# Patient Record
Sex: Female | Born: 1965 | Race: Black or African American | Hispanic: No | Smoking: Never smoker
Health system: Southern US, Community
[De-identification: ages and names within clinical notes are randomized; demographics above are authoritative.]

## PROBLEM LIST (undated history)

## (undated) DIAGNOSIS — IMO0002 Reserved for concepts with insufficient information to code with codable children: Secondary | ICD-10-CM

## (undated) DIAGNOSIS — T7840XA Allergy, unspecified, initial encounter: Secondary | ICD-10-CM

## (undated) DIAGNOSIS — R011 Cardiac murmur, unspecified: Secondary | ICD-10-CM

## (undated) DIAGNOSIS — E785 Hyperlipidemia, unspecified: Secondary | ICD-10-CM

## (undated) DIAGNOSIS — D649 Anemia, unspecified: Secondary | ICD-10-CM

## (undated) HISTORY — DX: Anemia, unspecified: D64.9

## (undated) HISTORY — PX: BUNIONECTOMY: SHX129

## (undated) HISTORY — DX: Hyperlipidemia, unspecified: E78.5

## (undated) HISTORY — PX: WISDOM TOOTH EXTRACTION: SHX21

## (undated) HISTORY — DX: Allergy, unspecified, initial encounter: T78.40XA

## (undated) HISTORY — DX: Cardiac murmur, unspecified: R01.1

## (undated) HISTORY — PX: ABDOMINAL HYSTERECTOMY: SHX81

## (undated) HISTORY — PX: POLYPECTOMY: SHX149

## (undated) HISTORY — DX: Reserved for concepts with insufficient information to code with codable children: IMO0002

---

## 1998-09-09 ENCOUNTER — Other Ambulatory Visit: Admission: RE | Admit: 1998-09-09 | Discharge: 1998-09-09 | Payer: Self-pay | Admitting: Obstetrics & Gynecology

## 1999-09-22 ENCOUNTER — Ambulatory Visit (HOSPITAL_COMMUNITY): Admission: RE | Admit: 1999-09-22 | Discharge: 1999-09-22 | Payer: Self-pay | Admitting: Family Medicine

## 2002-09-20 ENCOUNTER — Other Ambulatory Visit: Admission: RE | Admit: 2002-09-20 | Discharge: 2002-09-20 | Payer: Self-pay | Admitting: Obstetrics and Gynecology

## 2003-02-11 ENCOUNTER — Observation Stay (HOSPITAL_COMMUNITY): Admission: RE | Admit: 2003-02-11 | Discharge: 2003-02-12 | Payer: Self-pay | Admitting: Obstetrics and Gynecology

## 2003-02-11 ENCOUNTER — Encounter (INDEPENDENT_AMBULATORY_CARE_PROVIDER_SITE_OTHER): Payer: Self-pay

## 2003-09-30 ENCOUNTER — Encounter: Admission: RE | Admit: 2003-09-30 | Discharge: 2003-09-30 | Payer: Self-pay | Admitting: Family Medicine

## 2005-02-09 ENCOUNTER — Ambulatory Visit: Payer: Self-pay | Admitting: Internal Medicine

## 2007-06-06 ENCOUNTER — Ambulatory Visit: Payer: Self-pay | Admitting: Family Medicine

## 2007-07-12 ENCOUNTER — Ambulatory Visit: Payer: Self-pay | Admitting: Family Medicine

## 2007-08-14 ENCOUNTER — Ambulatory Visit: Payer: Self-pay | Admitting: Family Medicine

## 2008-02-29 ENCOUNTER — Ambulatory Visit: Payer: Self-pay | Admitting: Family Medicine

## 2008-02-29 ENCOUNTER — Encounter: Admission: RE | Admit: 2008-02-29 | Discharge: 2008-02-29 | Payer: Self-pay | Admitting: Family Medicine

## 2008-03-06 ENCOUNTER — Encounter: Admission: RE | Admit: 2008-03-06 | Discharge: 2008-03-06 | Payer: Self-pay | Admitting: Chiropractic Medicine

## 2009-10-20 ENCOUNTER — Ambulatory Visit: Payer: Self-pay | Admitting: Physician Assistant

## 2010-03-18 ENCOUNTER — Ambulatory Visit: Payer: Self-pay | Admitting: Physician Assistant

## 2010-09-16 ENCOUNTER — Ambulatory Visit (INDEPENDENT_AMBULATORY_CARE_PROVIDER_SITE_OTHER): Payer: 59 | Admitting: Family Medicine

## 2010-09-16 ENCOUNTER — Encounter: Payer: Self-pay | Admitting: Family Medicine

## 2010-09-16 DIAGNOSIS — J309 Allergic rhinitis, unspecified: Secondary | ICD-10-CM

## 2010-09-16 DIAGNOSIS — Z0000 Encounter for general adult medical examination without abnormal findings: Secondary | ICD-10-CM

## 2010-09-16 DIAGNOSIS — Z13220 Encounter for screening for lipoid disorders: Secondary | ICD-10-CM

## 2010-09-16 DIAGNOSIS — R519 Headache, unspecified: Secondary | ICD-10-CM

## 2010-09-16 DIAGNOSIS — Z136 Encounter for screening for cardiovascular disorders: Secondary | ICD-10-CM

## 2010-09-16 DIAGNOSIS — E785 Hyperlipidemia, unspecified: Secondary | ICD-10-CM

## 2010-09-16 DIAGNOSIS — R51 Headache: Secondary | ICD-10-CM

## 2010-09-16 DIAGNOSIS — R011 Cardiac murmur, unspecified: Secondary | ICD-10-CM

## 2010-09-16 LAB — BASIC METABOLIC PANEL
BUN: 10 mg/dL (ref 6–23)
Potassium: 4.1 mEq/L (ref 3.5–5.1)

## 2010-09-16 LAB — LIPID PANEL
HDL: 87.8 mg/dL (ref >39.00)
VLDL: 5.6 mg/dL (ref 0.0–40.0)

## 2010-09-16 MED ORDER — FLUTICASONE FUROATE 27.5 MCG/SPRAY NA SUSP: 2.0000 | Freq: Every day | NASAL | Status: DC

## 2010-09-16 NOTE — Assessment & Plan Note (Addendum)
Reviewed preventive care protocols, scheduled due services, and updated immunizations Discussed nutrition, exercise, diet, and healthy lifestyle.  Orders Placed This Encounter  Procedures  . Lipid panel  . Basic metabolic panel  . Ambulatory referral to Cardiology    Referral Priority:  Routine    Referral Type:  Consultation    Referral Reason:  Specialty Services Required    Requested Specialty:  Cardiology    Number of Visits Requested:  1

## 2010-09-16 NOTE — Progress Notes (Signed)
Subjective:    Patient ID: Patricia Dunlap, female    DOB: [birth year], 45 y.o.   MRN: [id number]  HPI 45 yo here to establish care and for CPX.  Overall, doing very well. Has OBGYN, Dr. Cherly Hensen- last mammogram and physical in October 2011. S/p hysterectomy for fibroid tumors in 2005.  Allergic rhinitis-  Seasonal allergies, notices it is getting worse.  Having frontal headaches several times per week.  Headaches not associated with photophobia, nausea, vomiting, or focal neurological deficits.  Taking Excedrin migraine as needed which does help with headaches. Claritin OTC not helping.  Heart murmur- diagnosed in her 30s.  No h/o palpitations, CP, SOB or LE edema.  Very physically active without difficulty. Had an echo 2 years ago, Dr. Clarene Duke.   Review of Systems  Constitutional: Negative for fever, chills and unexpected weight change.  HENT: Positive for congestion, sneezing and postnasal drip.   Eyes: Positive for itching.  Respiratory: Negative for cough, shortness of breath and wheezing.   Genitourinary: Negative for dysuria.  Neurological: Negative for dizziness.  Psychiatric/Behavioral: Negative for dysphoric mood.   Past Medical History  Diagnosis Date  . Allergy   . Heart murmur     Past Surgical History  Procedure Date  . Abdominal hysterectomy     History   Social History  . Marital Status: Married    Spouse Name: N/A    Number of Children: N/A  . Years of Education: N/A   Occupational History  . Not on file.   Social History Main Topics  . Smoking status: Never Smoker   . Smokeless tobacco: Not on file  . Alcohol Use: Yes  . Drug Use: Not on file  . Sexually Active: Yes   Other Topics Concern  . Not on file   Social History Narrative  . No narrative on file    Family History  Problem Relation Age of Onset  . Hyperlipidemia Mother   . Hypertension Father     No Known Allergies  No current outpatient prescriptions on file prior to  visit.    BP 120/80  Pulse 72  Temp(Src) 98.1 F (36.7 C) (Oral)  Ht 5' (1.524 m)  Wt 115 lb (52.164 kg)  BMI 22.46 kg/m2       Objective:   Physical Exam  General:  Well-developed,well-nourished,in no acute distress; alert,appropriate and cooperative throughout examination Head:  normocephalic and atraumatic.   Eyes:  vision grossly intact, pupils equal, pupils round, and pupils reactive to light.   Ears:  R ear normal and L ear normal.   Nose:  Boggy turbinates, sinuses NTTP, no signs of obstruction.   Mouth:  good dentition.   Neck:  No deformities, masses, or tenderness noted. Breasts:  Deferred, has OBGYN Lungs:  Normal respiratory effort, chest expands symmetrically. Lungs are clear to auscultation, no crackles or wheezes. Heart:  Normal rate and regular rhythm. 2/6 murmur. Abdomen:  Bowel sounds positive,abdomen soft and non-tender without masses, organomegaly or hernias noted. Genitalia:  Pelvic Exam:  deferred Msk:  No deformity or scoliosis noted of thoracic or lumbar spine.   Extremities:  No clubbing, cyanosis, edema, or deformity noted with normal full range of motion of all joints.   Neurologic:  alert & oriented X3 and gait normal.   Skin:  Intact without suspicious lesions or rashes Cervical Nodes:  No lymphadenopathy noted Axillary Nodes:  No palpable lymphadenopathy Psych:  Cognition and judgment appear intact. Alert and cooperative with normal attention span and  concentration. No apparent delusions, illusions, hallucinations         Assessment & Plan:

## 2010-09-16 NOTE — Assessment & Plan Note (Signed)
Likely multifactorial- tension and sinus.  Added flonase, also counselled against using excedrin migraine or other NSAIDs on regular basis as they can lead to rebound headaches and renal issues.  Pt will call in 2 weeks with an update of her headaches.

## 2010-09-16 NOTE — Patient Instructions (Signed)
Great to meet you. Please stop by to see Marion on your way out. 

## 2010-09-16 NOTE — Assessment & Plan Note (Signed)
Will add Flonase.  Continue Claritin.

## 2010-09-16 NOTE — Assessment & Plan Note (Signed)
Sounds benign, will refer to cards for follow up.

## 2010-09-24 ENCOUNTER — Encounter: Payer: Self-pay | Admitting: Family Medicine

## 2010-10-07 ENCOUNTER — Encounter: Payer: Self-pay | Admitting: Family Medicine

## 2010-10-23 ENCOUNTER — Institutional Professional Consult (permissible substitution): Payer: 59 | Admitting: Cardiology

## 2010-11-12 ENCOUNTER — Telehealth: Payer: Self-pay | Admitting: Cardiology

## 2010-11-12 ENCOUNTER — Encounter: Payer: Self-pay | Admitting: Cardiology

## 2010-11-12 ENCOUNTER — Ambulatory Visit (INDEPENDENT_AMBULATORY_CARE_PROVIDER_SITE_OTHER): Payer: 59 | Admitting: Cardiology

## 2010-11-12 DIAGNOSIS — R011 Cardiac murmur, unspecified: Secondary | ICD-10-CM

## 2010-11-12 DIAGNOSIS — J309 Allergic rhinitis, unspecified: Secondary | ICD-10-CM

## 2010-11-12 NOTE — Assessment & Plan Note (Signed)
Patient found to have a soft ejection murmur. She had an echocardiogram at Dr. Fredirick Maudlin office some years ago. I will request records concerning her visit and previous echocardiogram. If her echo was normal then I do not think further evaluation is needed.

## 2010-11-12 NOTE — Telephone Encounter (Addendum)
Records received back from [facility] gave to [facility]  11/12/10  Echo received from [facility] gave to [facility]  12/01/10/km

## 2010-11-12 NOTE — Assessment & Plan Note (Signed)
Management per primary care. 

## 2010-11-12 NOTE — Telephone Encounter (Signed)
ROI faxed over to [facility] @ [phone]  11/12/10/km

## 2010-11-12 NOTE — Patient Instructions (Signed)
Your physician recommends that you schedule a follow-up appointment as needed  

## 2010-11-12 NOTE — Progress Notes (Signed)
HPI: 45 yo female for evaluation of murmur. Apparently had an echocardiogram at Dr. Fredirick Maudlin office 2 years ago. Those records are not available. She denies dyspnea on exertion, orthopnea, PND, pedal edema, palpitations, syncope or exertional chest pain. She was recently seen by her primary care physician and noted to have a murmur. Cardiology is therefore asked to further evaluate.  Current Outpatient Prescriptions  Medication Sig Dispense Refill  . Cholecalciferol (VITAMIN D) 2000 UNITS CAPS Take 1 capsule by mouth daily.        . DiphenhydrAMINE HCl (ALLERGY MEDICATION PO) Take 1 tablet by mouth as needed.        . fluticasone (VERAMYST) 27.5 MCG/SPRAY nasal spray 2 sprays by Nasal route daily.  10 g  2  . Multiple Vitamin (MULTIVITAMIN) capsule Take 1 capsule by mouth daily.          No Known Allergies  Past Medical History  Diagnosis Date  . Allergy   . Heart murmur     Past Surgical History  Procedure Date  . Abdominal hysterectomy     History   Social History  . Marital Status: Married    Spouse Name: N/A    Number of Children: N/A  . Years of Education: N/A   Occupational History  . Not on file.   Social History Main Topics  . Smoking status: Never Smoker   . Smokeless tobacco: Not on file  . Alcohol Use: Yes  . Drug Use: Not on file  . Sexually Active: Yes   Other Topics Concern  . Not on file   Social History Narrative  . No narrative on file    Family History  Problem Relation Age of Onset  . Hyperlipidemia Mother   . Hypertension Father     ROS: no fevers or chills, productive cough, hemoptysis, dysphasia, odynophagia, melena, hematochezia, dysuria, hematuria, rash, seizure activity, orthopnea, PND, pedal edema, claudication. Remaining systems are negative.  Physical Exam: General:  Well developed/well nourished in NAD Skin warm/dry Patient not depressed No peripheral clubbing Back-normal HEENT-normal/normal eyelids Neck supple/normal carotid  upstroke bilaterally; no bruits; no JVD; no thyromegaly chest - CTA/ normal expansion CV - RRR/normal S1 and S2; no rubs or gallops;  PMI nondisplaced; 2/6 systolic ejection murmur. Abdomen -NT/ND, no HSM, no mass, + bowel sounds, no bruit 2+ femoral pulses, no bruits Ext-no edema, chords, 2+ DP Neuro-grossly nonfocal  ECG Normal sinus rhythm at a rate of 71. No ST changes.

## 2010-11-13 NOTE — H&P (Signed)
NAME:  Patricia Dunlap, Patricia Dunlap                       ACCOUNT NO.:  [id number]   MEDICAL RECORD NO.:  [id number]                   PATIENT TYPE:  AMB   LOCATION:  OPW                                  FACILITY:  [facility]   PHYSICIAN:  Maxie Better, M.D.            DATE OF BIRTH:  11/15/1965   DATE OF ADMISSION:  02/11/2003  DATE OF DISCHARGE:                                HISTORY & PHYSICAL   CHIEF COMPLAINT:  Heavy menses with uterine fibroids.   HISTORY OF PRESENT ILLNESS:  This is a 45 year old, G0, married, African-  American female with last menstrual period August 26, 2002, who is now  being admitted for a laparoscopic-assisted vaginal hysterectomy secondary to  symptomatic uterine fibroids.  The patient was first evaluated by me on  September 20, 2002, for complaint of abnormal vaginal bleeding.  At that time,  the patient reported complaints of heavy menses for the past eight years.  Her cycles have lasted six to seven days and occurring every 28 days.  The  patient notes her first three days she changes tampon and pad every one to  two hours which were soaked.  She had twenty-five to fifty cent clots and  increased cramps.  The patient desires no children.  She was previously  under the care of Dr. Ilda Mori, at whose office she underwent a pelvic  ultrasound in February 2003, that showed a uterus that was 14.1 x 6 cm with  multiple fibroids and normal ovaries.  In March 2004, she had a normal TSH  and a hemoglobin of 8.9.  The patient was placed on iron supplementation.  Evaluation by me included an endometrial biopsy in March 2004, that showed  benign proliferative endometrium.  The patient had Depo-Lupron therapy since  April 2004, with resultant shrinkage of her uterine fibroids and increase of  her hemoglobin to 12.5 as of December 27, 2002.  The patient desires no future  childbearing abilities and wants to proceed with definitive therapy.  Alternative treatments were  declined by the patient.   ALLERGIES:  No known drug allergies.   MEDICATIONS:  1. Iron supplementation.  2. Depo-Lupron therapy with last injection January 23, 2003.   PAST MEDICAL HISTORY:  Heart murmur for which an echocardiogram was done  that showed trace aortic regurgitation, trace mitral regurgitation.   PAST SURGICAL HISTORY:  Foot surgery.   FAMILY HISTORY:  Mother and father with hypertension.  No colon, ovarian or  breast cancer.   SOCIAL HISTORY:  Married with step-daughter.  Works at [employer] in [employer] area.   REVIEW OF SYMPTOMS:  As per history of present illness, otherwise notable  for Dr. Clarene Duke being her cardiologist.  No antibiotics for prophylaxis  indicated per patient.   PHYSICAL EXAMINATION:  GENERAL:  Well-developed, well-nourished, petite,  African-American female in no acute distress.  VITAL SIGNS:  Blood pressure 124/70, weight 112 pounds.  SKIN:  No lesions.  HEENT:  Anicteric sclerae.  Pink conjunctivae.  Oropharynx negative.  HEART:  Regular rate and rhythm with a grade 2/6 systolic ejection murmur.  NECK:  Supple.  Thyroid not palpable.  LYMPHS:  No supraclavicular, axillary or inguinal nodes palpable.  ABDOMEN:  Soft, nontender, palpable mass about two fingerbreadths above the  symphysis pubis.  BREASTS:  Soft, nontender, no palpable mass.  BACK:  No CVA tenderness.  PELVIC:  Vulva showed no lesions.  Vagina had no discharge.  Cervix was  closed.  Uterus was anteverted and regular about eight-week size.  Adnexa  with no palpable mass.  RECTAL:  Deferred.  EXTREMITIES:  No edema or calf tenderness.   IMPRESSION:  Symptomatic uterine fibroids.   PLAN:  Admission for laparoscopic-assisted vaginal hysterectomy.  The risk  of the procedure was explained to the patient including, but not limited to,  no further childbearing, absence of menses, infection, bleeding which may  require blood transfusion, injury to  surrounding organ structures such as  the bladder, bowels, ureter, possible need for surgery retrospect to her  retained ovaries in the future, specifically ovarian cancer and ovarian  cysts, fistula formation, internal scar tissue which may cause pelvic pain  or bowel obstruction, inability to complete the procedure vaginally  necessitating an abdominal route, transfusion risk including HIV  transmission, hepatitis and acute febrile reaction reviewed.  Postop care  and criteria for discharge were reviewed.  Antibiotics prophylaxis.  Antiembolic stockings planned.  All questions were answered.                                               Maxie Better, M.D.    Loop/MEDQ  D:  02/09/2003  T:  02/09/2003  Job:  [id number]

## 2010-11-13 NOTE — Op Note (Signed)
NAME:  Patricia Dunlap, Patricia Dunlap                       ACCOUNT NO.:  [id number]   MEDICAL RECORD NO.:  [id number]                   PATIENT TYPE:  OBV   LOCATION:  9118                                 FACILITY:  [facility]   PHYSICIAN:  Maxie Better, M.D.            DATE OF BIRTH:  Jun 11, 1966   DATE OF PROCEDURE:  02/11/2003  DATE OF DISCHARGE:                                 OPERATIVE REPORT   PREOPERATIVE DIAGNOSIS:  Symptomatic uterine fibroids.   PROCEDURES:  1. Examination under anesthesia.  2. Laparoscopic-assisted vaginal hysterectomy.   POSTOPERATIVE DIAGNOSES:  1. Symptomatic uterine fibroids.  2. Pelvic endometriosis.   ANESTHESIA:  General.   SURGEON:  Sheronette A. Cherly Hensen, M.D.   ASSISTANT:  Richardean Sale, M.D.   INDICATIONS FOR PROCEDURE:  This is a 45 year old G0, married black female  with symptomatic uterine fibroid who is now being admitted for laparoscopic-  assisted vaginal hysterectomy. The patient underwent three months of Depo-  Lupron therapy in order to increase her hemoglobin from 8.9 to the current  over 12.  She now presents for definitive surgical management.  The patient  desires no ability to bear a child or have her cycles.  CPE is dictated in  history and physical for the specific details.   PROCEDURE:  The patient was taken to the operating room where she was placed  in the dorsal lithotomy position.  Examination under anesthesia revealed a  regular, anteverted, 8-10 weeks' size uterus.  No adnexal masses could be  appreciated.  The patient was sterilely prepped and draped for a  hysterectomy.  An indwelling Foley catheter was sterilely placed.  A bivalve  speculum was placed in the vagina.  A single-tooth tenaculum was placed in  the anterior lip of the cervix.  An acorn cannula was introduced into the  cervical os and attached to the tenaculum for manipulation of the uterus.  The bivalve speculum was then removed, attention was then turned to  the  abdomen.  Marcaine 0.25% was injected infraumbilically.  An infraumbilical  incision was then made.  The Veress needle was introduced into the abdomen,  tested with saline, good placement noted.  Carbon dioxide was insufflated.  Opening pressure of 6 was noted.  About 2.5 liters of carbon dioxide was  insufflated.  The Veress needle was then removed.  A 10-mm disposable trocar was introduced into the abdomen without incident.  The lighted video laparoscope was then introduced.  The pelvis was  inspected.  Multiple subserosal pedunculated fibroids were noted.  Normal  liver edge was seen.  Marcaine 0.25% was then used to inject on the lower  half of the abdomen laterally and two small incisions were made on the right  and left side and 5-mm ports were introduced through those incisions.  With  a probe the pelvis was inspected.  The anterior cul-de-sac showed no  lesions.  Normal tubes and ovaries are noted bilaterally.  In the posterior  cul-de-sac there was multiple Allen-Masters windows noted consistent with  probable pelvic endometriosis.  The appendix was noted to be normal.  The  left round ligament was cauterized with the tripolar and then severed.  The  left utero-ovarian ligaments were then serially clamped, cauterized, and cut  until the tube and ovary on the left was severed from their uterine  attachment.  This same procedure was performed on the contralateral side.  The bladder peritoneum was then tented and opened with hot scissors and  extended transversely.  The abdomen was irrigated and suctioned of debris.  At this point the tripolar was removed.  The scope was also removed.  The  abdomen was deflated and attention was then turned to the vagina.  The  patient was placed further in the dorsal lithotomy position.  The weighted  speculum was placed in the vagina.  All instruments on the cervix were  removed.  A Jacobson clamp was then placed on the anterior lip and a  single-  tooth tenaculum was placed on the posterior lip for traction on the cervix.  A dilute solution of Pitressin was injected circumferentially at the  junction of the vagina and cervix and a circumferential incision was then  made.  The posterior cul-de-sac was then opened and extended transversely.  The weighted speculum was then placed through the opening posteriorly.  The  uterosacral ligaments were bilaterally clamped, cut, and suture ligated with  0 Vicryl suture bilaterally.  Using the LigaSure, clamping bilaterally of  the cardinal ligaments was then performed and cut.  Attention was then  turned back to the anterior cul-de-sac.  The anterior cul-de-sac was  subsequently opened.  After continued dissection the ability to open the cul-  de-sac had been limited by an anterior fibroid noted.  The uterine vessels  were then bilaterally clamped with the LigaSure and subsequently cut.  The  remaining attachments of the uterus to the lateral side wall on the right of  the patient was eventually severed.  The left side was limited due to the  bulkiness of the uterus.  The decision was then made to core the cervix from  its attachment to the body of the uterus.  This was done.  Several  additional fibroids were removed.  In the process of removing a large  submucosal fibroid in the center of the fundal area, the last remaining  attachment on the left side of the patient was seen, clamped with the  LigaSure, and then cut.  The uterus was then removed in its entirety.  Bleeding along the vaginal cuff and the lateral aspect of the vagina was  hemostased with 0 Vicryl figure-of-eights in new sutures.  This was  performed also on the contralateral side.  The vaginal cuff posteriorly was  reefed to 0 Vicryl running lock stitch.  Due to the multiple Allen-Masters  windows in the posterior cul-de-sac, decision was made not to perform a [employer].  The vagina was then closed in a  vertical fashion using  interrupted 0 Vicryl sutures in single and figure-of-eight sutures and the  uterosacral ligament was approximated in the midline and tied prior to  closure of the vagina entirely.  The vagina was irrigated.  There was a  small bleeder on the left lateral aspect of the vaginal wall which was  cauterized.  Attention was then turned back to the abdomen which was  reinsufflated.  The pelvis was irrigated and suctioned.  Small  bleeding on  the left aspect of the cuff was cauterized.  Resuction of the abdomen was  then performed.  The abdomen was partially deflated to view any, if  possible, additional bleeders.  None were seen.  At this time it was felt  the procedure was completed.  The lower ports were removed under direct  visualization.  The infraumbilical port was removed while taking care not to  bring up any underlying structures.  The fascia was identified in the  infraumbilical incision and closed with 0 Vicryl x2.  The skin incisions  were approximated using Dermabond.  Attention was then turned back to the  vagina to particularly look at that left lateral aspect with the small  bleeder.  It was still noted to be bleeding and a 3-0 chromic figure-of-  eight suture was then placed with good hemostasis noted.  Specimen was  uterus and cervix sent to pathology.  Estimated blood loss was 100 mL.  Intraoperative fluid was 2,200 mL crystalloid.  Urine output was 300 mL  clear yellow urine. Sponge and instrument counts x2 is correct.  Please note  that both ureters were seen bilaterally during the surgery.  The patient  tolerated the procedure well, was transferred to the recovery room in stable  condition.                                               Maxie Better, M.D.    [place]/MEDQ  D:  02/11/2003  T:  02/11/2003  Job:  [id number]

## 2010-12-25 ENCOUNTER — Encounter: Payer: Self-pay | Admitting: Cardiology

## 2011-12-03 ENCOUNTER — Other Ambulatory Visit: Payer: Self-pay | Admitting: Family Medicine

## 2012-06-08 ENCOUNTER — Ambulatory Visit (INDEPENDENT_AMBULATORY_CARE_PROVIDER_SITE_OTHER): Payer: 59 | Admitting: Family Medicine

## 2012-06-08 ENCOUNTER — Ambulatory Visit (INDEPENDENT_AMBULATORY_CARE_PROVIDER_SITE_OTHER)
Admission: RE | Admit: 2012-06-08 | Discharge: 2012-06-08 | Disposition: A | Discharge status: Home / Self Care | Payer: 59 | Source: Ambulatory Visit | Attending: Family Medicine | Admitting: Family Medicine

## 2012-06-08 ENCOUNTER — Encounter: Payer: Self-pay | Admitting: Family Medicine

## 2012-06-08 VITALS — BP 120/68 | HR 80 | Temp 98.3°F | Wt 119.0 lb

## 2012-06-08 DIAGNOSIS — M545 Low back pain, unspecified: Secondary | ICD-10-CM

## 2012-06-08 MED ORDER — CYCLOBENZAPRINE HCL 5 MG PO TABS: 5.0000 mg | ORAL_TABLET | Freq: Two times a day (BID) | ORAL | Status: DC | PRN

## 2012-06-08 NOTE — Progress Notes (Signed)
SUBJECTIVE:  Patricia Dunlap is a 46 y.o. female who complains of an injury causing low back pain 3 week(s) ago. The pain is positional with bending or lifting, with radiation down the legs. Mechanism of injury: lifting a heavy barrel while raking leaves. Symptoms have been intermittent since that time. Prior history of back problems: recurrent self limited episodes of low back pain in the past. There is no numbness in the legs.  Patient Active Problem List  Diagnosis  . Allergic rhinitis  . Generalized headaches  . Heart murmur   Past Medical History  Diagnosis Date  . Allergy   . Heart murmur    Past Surgical History  Procedure Date  . Abdominal hysterectomy    History  Substance Use Topics  . Smoking status: Never Smoker   . Smokeless tobacco: Not on file  . Alcohol Use: Yes   Family History  Problem Relation Age of Onset  . Hyperlipidemia Mother   . Hypertension Father    No Known Allergies Current Outpatient Prescriptions on File Prior to Visit  Medication Sig Dispense Refill  . Cholecalciferol (VITAMIN D) 2000 UNITS CAPS Take 1 capsule by mouth daily.        . Multiple Vitamin (MULTIVITAMIN) capsule Take 1 capsule by mouth daily.         The PMH, PSH, Social History, Family History, Medications, and allergies have been reviewed in [facility], and have been updated if relevant.  OBJECTIVE: BP 120/68  Pulse 80  Temp 98.3 F (36.8 C)  Wt 119 lb (53.978 kg)  Vital signs as noted above. Patient appears to be in mild to moderate pain, antalgic gait noted. Lumbosacral spine area reveals no local tenderness or mass. Painful and reduced LS ROM noted. Straight leg raise is positive at 45 degrees on left. DTR's, motor strength and sensation normal, including heel and toe gait.  Peripheral pulses are palpable. Lumbar spine X-Ray: ordered, but results not yet available.   ASSESSMENT:  lumbar strain with ? Herniated disk  PLAN: For acute pain, rest, intermittent application of  cold packs (later, may switch to heat, but do not sleep on heating pad), analgesics and muscle relaxants are recommended. Discussed longer term treatment plan of prn NSAID's and discussed a home back care exercise program with flexion exercise routine. Proper lifting with avoidance of heavy lifting discussed. Consider Physical Therapy and XRay studies if not improving. Call or return to clinic prn if these symptoms worsen or fail to improve as anticipated.

## 2012-06-08 NOTE — Patient Instructions (Addendum)
Good to see you.  Have a wonderful Christmas. We will call you with your xray results.  Aleve and/or tylenol as needed for pain. Flexeril at night as needed.  If not improving at follow-up we will consider further imaging and/or physical therapy.   Sciatica Sciatica is pain, weakness, numbness, or tingling along the path of the sciatic nerve. The nerve starts in the lower back and runs down the back of each leg. The nerve controls the muscles in the lower leg and in the back of the knee, while also providing sensation to the back of the thigh, lower leg, and the sole of your foot. Sciatica is a symptom of another medical condition. For instance, nerve damage or certain conditions, such as a herniated disk or bone spur on the spine, pinch or put pressure on the sciatic nerve. This causes the pain, weakness, or other sensations normally associated with sciatica. Generally, sciatica only affects one side of the body. CAUSES   Herniated or slipped disc.  Muscle spasm  Degenerative disk disease.  A pain disorder involving the narrow muscle in the buttocks (piriformis syndrome).  Pelvic injury or fracture.  Pregnancy. SYMPTOMS  Symptoms can vary from mild to very severe. The symptoms usually travel from the low back to the buttocks and down the back of the leg. Symptoms can include:  Mild tingling or dull aches in the lower back, leg, or hip.  Numbness in the back of the calf or sole of the foot.  Burning sensations in the lower back, leg, or hip.  Sharp pains in the lower back, leg, or hip.  Leg weakness.  Severe back pain inhibiting movement. These symptoms may get worse with coughing, sneezing, laughing, or prolonged sitting or standing. Also, being overweight may worsen symptoms. DIAGNOSIS  Your caregiver will perform a physical exam to look for common symptoms of sciatica. He or she may ask you to do certain movements or activities that would trigger sciatic nerve pain. Other  tests may be performed to find the cause of the sciatica. These may include:  Blood tests.  X-rays.  Imaging tests, such as an MRI or CT scan. TREATMENT  Treatment is directed at the cause of the sciatic pain. Sometimes, treatment is not necessary and the pain and discomfort goes away on its own. If treatment is needed, your caregiver may suggest:  Over-the-counter medicines to relieve pain.  Prescription medicines, such as anti-inflammatory medicine, muscle relaxants, or narcotics.  Applying heat or ice to the painful area.  Steroid injections to lessen pain, irritation, and inflammation around the nerve.  Reducing activity during periods of pain.  Exercising and stretching to strengthen your abdomen and improve flexibility of your spine. Your caregiver may suggest losing weight if the extra weight makes the back pain worse.  Physical therapy.  Surgery to eliminate what is pressing or pinching the nerve, such as a bone spur or part of a herniated disk. HOME CARE INSTRUCTIONS   Only take over-the-counter or prescription medicines for pain or discomfort as directed by your caregiver.  Apply ice to the affected area for 20 minutes, 3 4 times a day for the first 48 72 hours. Then try heat in the same way.  Exercise, stretch, or perform your usual activities if these do not aggravate your pain.  Attend physical therapy sessions as directed by your caregiver.  Keep all follow-up appointments as directed by your caregiver.  Do not wear high heels or shoes that do not provide proper  support.  Check your mattress to see if it is too soft. A firm mattress may lessen your pain and discomfort. SEEK IMMEDIATE MEDICAL CARE IF:   You lose control of your bowel or bladder (incontinence).  You have increasing weakness in the lower back, pelvis, buttocks, or legs.  You have redness or swelling of your back.  You have a burning sensation when you urinate.  You have pain that gets  worse when you lie down or awakens you at night.  Your pain is worse than you have experienced in the past.  Your pain is lasting longer than 4 weeks.  You are suddenly losing weight without reason. MAKE SURE YOU:  Understand these instructions.  Will watch your condition.  Will get help right away if you are not doing well or get worse. Document Released: 06/08/2001 Document Revised: 12/14/2011 Document Reviewed: 10/24/2011 [facility] Patient Information 2013 [place], [place].

## 2012-08-03 ENCOUNTER — Encounter: Payer: Self-pay | Admitting: Family Medicine

## 2012-08-07 ENCOUNTER — Encounter: Payer: Self-pay | Admitting: Family Medicine

## 2012-08-08 ENCOUNTER — Encounter: Payer: Self-pay | Admitting: *Deleted

## 2012-08-08 ENCOUNTER — Encounter: Payer: Self-pay | Admitting: Family Medicine

## 2012-08-08 LAB — HM DEXA SCAN: HM Dexa Scan: NORMAL

## 2013-09-13 ENCOUNTER — Ambulatory Visit (INDEPENDENT_AMBULATORY_CARE_PROVIDER_SITE_OTHER): Payer: 59

## 2013-09-13 ENCOUNTER — Ambulatory Visit (INDEPENDENT_AMBULATORY_CARE_PROVIDER_SITE_OTHER): Payer: 59 | Admitting: Podiatry

## 2013-09-13 ENCOUNTER — Encounter: Payer: Self-pay | Admitting: Podiatry

## 2013-09-13 VITALS — BP 142/80 | HR 72 | Resp 15 | Ht 60.0 in | Wt 115.0 lb

## 2013-09-13 DIAGNOSIS — M722 Plantar fascial fibromatosis: Secondary | ICD-10-CM

## 2013-09-13 DIAGNOSIS — M79609 Pain in unspecified limb: Secondary | ICD-10-CM

## 2013-09-13 DIAGNOSIS — M79606 Pain in leg, unspecified: Secondary | ICD-10-CM

## 2013-09-13 MED ORDER — TRIAMCINOLONE ACETONIDE 10 MG/ML IJ SUSP
10.0000 mg | Freq: Once | INTRAMUSCULAR | Status: AC
  Administered 2013-09-13: 10 mg

## 2013-09-13 NOTE — Progress Notes (Signed)
   Subjective:    Patient ID: Patricia Dunlap, female    DOB: Jul 12, 1965, 48 y.o.   MRN: [id number]  HPI N plantar fasciitis        L  B/L heels and inferior arches        D 4 - 5 months        O history of similar symptoms 2 years ago        C burning, and tightness        A worse after resting and in the morning        T Aleve, and heating pad Pt states has a hard spot beneath Right 1st toe.    Review of Systems  All other systems reviewed and are negative.       Objective:   Physical Exam        Assessment & Plan:

## 2013-09-13 NOTE — Patient Instructions (Signed)

## 2013-09-14 NOTE — Progress Notes (Signed)
Subjective:     Patient ID: Patricia Dunlap, female   DOB: [birth year], 48 y.o.   MRN: [id number]  HPI patient presents stating I'm having a lot of pain in my heels that have been present for about 5 months. I had an episode of this several years ago and I do by 5 miles now 3-4 times a week   Review of Systems  All other systems reviewed and are negative.       Objective:   Physical Exam  Nursing note and vitals reviewed. Constitutional: She is oriented to person, place, and time.  Cardiovascular: Intact distal pulses.   Musculoskeletal: Normal range of motion.  Neurological: She is oriented to person, place, and time.  Skin: Skin is warm.   neurovascular status is intact with muscle strength adequate and normal range of motion subtalar midtarsal joint. Mild depression of the arch with good Fill time to the digits one through 5 of both feet and significant discomfort in the heel region of both feet at the insertion of the tendon into the calcaneus     Assessment:     Plantar fasciitis with inflammation and structural changes to the foot both feet    Plan:     H&P and x-rays reviewed and today I injected the plantar fascia of both heels 3 mg Kenalog 5 mg Xylocaine Marcaine mixture and dispensed fascial brace for both feet. Discussed orthotic therapy for this patient

## 2013-09-20 ENCOUNTER — Ambulatory Visit (INDEPENDENT_AMBULATORY_CARE_PROVIDER_SITE_OTHER): Payer: 59 | Admitting: Podiatry

## 2013-09-20 ENCOUNTER — Encounter: Payer: Self-pay | Admitting: Podiatry

## 2013-09-20 VITALS — BP 135/78 | HR 76 | Resp 12

## 2013-09-20 DIAGNOSIS — M722 Plantar fascial fibromatosis: Secondary | ICD-10-CM

## 2013-09-20 NOTE — Progress Notes (Signed)
Subjective:     Patient ID: Patricia Dunlap, female   DOB: [birth year], 48 y.o.   MRN: [id number]  HPI patient presents with improved heels of both feet wanting to be active and has not been able to take long walks or run   Review of Systems     Objective:   Physical Exam Neurovascular status intact with pain reduced plantar heel bilateral but still present    Assessment:     Plantar fasciitis of the heel region both feet improved but still sore    Plan:     Advised on physical therapy and shoe gear modification and scanned for custom orthotics to reduce stress against the plantar heels at this time

## 2013-10-05 ENCOUNTER — Other Ambulatory Visit: Payer: 59

## 2013-10-19 ENCOUNTER — Ambulatory Visit: Payer: 59 | Admitting: *Deleted

## 2013-10-19 DIAGNOSIS — M722 Plantar fascial fibromatosis: Secondary | ICD-10-CM

## 2013-10-19 NOTE — Progress Notes (Signed)
   Subjective:    Patient ID: Patricia Dunlap, female    DOB: 04-07-1966, 48 y.o.   MRN: [id number]  HPI PICK UP ORTHOTIC AND GIVEN INSTRUCTION.    Review of Systems     Objective:   Physical Exam        Assessment & Plan:

## 2013-10-19 NOTE — Patient Instructions (Signed)

## 2013-11-12 ENCOUNTER — Ambulatory Visit: Payer: 59 | Admitting: Podiatry

## 2013-11-23 ENCOUNTER — Other Ambulatory Visit: Payer: Self-pay | Admitting: Family Medicine

## 2013-11-23 DIAGNOSIS — Z0000 Encounter for general adult medical examination without abnormal findings: Secondary | ICD-10-CM | POA: Insufficient documentation

## 2013-11-23 DIAGNOSIS — Z136 Encounter for screening for cardiovascular disorders: Secondary | ICD-10-CM

## 2013-11-27 ENCOUNTER — Other Ambulatory Visit (INDEPENDENT_AMBULATORY_CARE_PROVIDER_SITE_OTHER): Payer: 59

## 2013-11-27 DIAGNOSIS — Z136 Encounter for screening for cardiovascular disorders: Secondary | ICD-10-CM

## 2013-11-27 DIAGNOSIS — Z0000 Encounter for general adult medical examination without abnormal findings: Secondary | ICD-10-CM

## 2013-11-27 LAB — TSH: TSH: 0.7 u[IU]/mL (ref 0.35–4.50)

## 2013-11-27 LAB — COMPREHENSIVE METABOLIC PANEL
ALK PHOS: 59 U/L (ref 39–117)
ALT: 34 U/L (ref 0–35)
AST: 32 U/L (ref 0–37)
Albumin: 4.2 g/dL (ref 3.5–5.2)
BUN: 11 mg/dL (ref 6–23)
CALCIUM: 9.9 mg/dL (ref 8.4–10.5)
CO2: 29 meq/L (ref 19–32)
CREATININE: 0.6 mg/dL (ref 0.4–1.2)
Chloride: 104 mEq/L (ref 96–112)
GFR: 129.82 mL/min (ref >60.00)
Glucose, Bld: 90 mg/dL (ref 70–99)
Potassium: 4.3 mEq/L (ref 3.5–5.1)
Sodium: 139 mEq/L (ref 135–145)
Total Bilirubin: 0.6 mg/dL (ref 0.2–1.2)
Total Protein: 7 g/dL (ref 6.0–8.3)

## 2013-11-27 LAB — CBC WITH DIFFERENTIAL/PLATELET
Basophils Absolute: 0 10*3/uL (ref 0.0–0.1)
Basophils Relative: 0.1 % (ref 0.0–3.0)
EOS ABS: 0.1 10*3/uL (ref 0.0–0.7)
Eosinophils Relative: 1.1 % (ref 0.0–5.0)
HEMATOCRIT: 37.8 % (ref 36.0–46.0)
Hemoglobin: 12.7 g/dL (ref 12.0–15.0)
LYMPHS PCT: 21.8 % (ref 12.0–46.0)
Lymphs Abs: 1.3 10*3/uL (ref 0.7–4.0)
MCHC: 33.5 g/dL (ref 30.0–36.0)
MCV: 89.9 fl (ref 78.0–100.0)
MONO ABS: 0.4 10*3/uL (ref 0.1–1.0)
MONOS PCT: 6.3 % (ref 3.0–12.0)
NEUTROS ABS: 4.1 10*3/uL (ref 1.4–7.7)
NEUTROS PCT: 70.7 % (ref 43.0–77.0)
Platelets: 252 10*3/uL (ref 150.0–400.0)
RBC: 4.21 Mil/uL (ref 3.87–5.11)
RDW: 13.6 % (ref 11.5–15.5)
WBC: 5.8 10*3/uL (ref 4.0–10.5)

## 2013-11-27 LAB — LIPID PANEL
CHOL/HDL RATIO: 3
CHOLESTEROL: 234 mg/dL — AB (ref 0–200)
HDL: 83.8 mg/dL (ref >39.00)
LDL CALC: 143 mg/dL — AB (ref 0–99)
Triglycerides: 37 mg/dL (ref 0.0–149.0)
VLDL: 7.4 mg/dL (ref 0.0–40.0)

## 2013-12-06 ENCOUNTER — Encounter (INDEPENDENT_AMBULATORY_CARE_PROVIDER_SITE_OTHER): Payer: Self-pay

## 2013-12-06 ENCOUNTER — Ambulatory Visit (INDEPENDENT_AMBULATORY_CARE_PROVIDER_SITE_OTHER): Payer: 59 | Admitting: Family Medicine

## 2013-12-06 ENCOUNTER — Encounter: Payer: Self-pay | Admitting: Family Medicine

## 2013-12-06 VITALS — BP 124/78 | HR 90 | Temp 98.0°F | Ht 59.75 in | Wt 122.5 lb

## 2013-12-06 DIAGNOSIS — N951 Menopausal and female climacteric states: Secondary | ICD-10-CM

## 2013-12-06 DIAGNOSIS — Z0000 Encounter for general adult medical examination without abnormal findings: Secondary | ICD-10-CM

## 2013-12-06 MED ORDER — ESTROGENS CONJUGATED 0.3 MG PO TABS: 0.3000 mg | ORAL_TABLET | Freq: Every day | ORAL | Status: DC

## 2013-12-06 NOTE — Assessment & Plan Note (Signed)
  Discussed risks and benefits of HRT.  Pt does not have a uterus so she can take unopposed estrogen.  She prefers not to use creams, but to try tabs.  eRx sent for premarin 0.3 mg daily. Follow up in a few weeks.

## 2013-12-06 NOTE — Patient Instructions (Addendum)
Great to see you. We are starting premarin 0.3 mg daily. Call me in a few weeks with an update.  Have a great summer.

## 2013-12-06 NOTE — Progress Notes (Signed)
Pre visit review using our clinic review tool, if applicable. No additional management support is needed unless otherwise documented below in the visit note. 

## 2013-12-06 NOTE — Assessment & Plan Note (Signed)
Reviewed preventive care protocols, scheduled due services, and updated immunizations Discussed nutrition, exercise, diet, and healthy lifestyle.  

## 2013-12-06 NOTE — Progress Notes (Signed)
Subjective:    Patient ID: Patricia Dunlap, female    DOB: [birth year], 48 y.o.   MRN: [id number]  HPI 48 yo pleasant female whom I have not seen for preventative care since 08/2010 here for CPX.  Has OBGYN, Dr. Garwin Brothers- last mammogram and physical in May 2015. S/p hysterectomy for fibroid tumors in 2005.  DEXA 08/08/2012- normal.  Having hot flashes, weight gain, insomnia, and vaginal dryness. Discussed with OBGYN- given rx for premarin cream. Has not yet started it- wanted to talk about first.  Wt Readings from Last 3 Encounters:  12/06/13 122 lb 8 oz (55.566 kg)  09/13/13 115 lb (52.164 kg)  06/08/12 119 lb (53.978 kg)  Going to the gym 3-4 days per week.  Cut back sugar and bread.  Lab Results  Component Value Date   CHOL 234* 11/27/2013   HDL 83.80 11/27/2013   LDLCALC 143* 11/27/2013   LDLDIRECT 115.7 09/16/2010   TRIG 37.0 11/27/2013   CHOLHDL 3 11/27/2013   Lab Results  Component Value Date   CREATININE 0.6 11/27/2013   Lab Results  Component Value Date   NA 139 11/27/2013   K 4.3 11/27/2013   CL 104 11/27/2013   CO2 29 11/27/2013   Lab Results  Component Value Date   WBC 5.8 11/27/2013   HGB 12.7 11/27/2013   HCT 37.8 11/27/2013   MCV 89.9 11/27/2013   PLT 252.0 11/27/2013    Heart murmur- diagnosed in her 26s.  No h/o palpitations, CP, SOB or LE edema.  Very physically active without difficulty. Had an echo in 2009-neg.   Review of Systems  Constitutional: Negative for fever, chills and unexpected weight change.  HENT: Negative for congestion, postnasal drip and sneezing.   Eyes: Negative for itching.  Respiratory: Negative for cough, shortness of breath and wheezing.   Cardiovascular: Negative.   Gastrointestinal: Negative.   Genitourinary: Negative.  Negative for dysuria.  Neurological: Negative for dizziness.  Psychiatric/Behavioral: Negative for dysphoric mood.   Past Medical History  Diagnosis Date  . Allergy   . Heart murmur     Past Surgical History   Procedure Laterality Date  . Abdominal hysterectomy      History   Social History  . Marital Status: Married    Spouse Name: N/A    Number of Children: N/A  . Years of Education: N/A   Occupational History  . Not on file.   Social History Main Topics  . Smoking status: Never Smoker   . Smokeless tobacco: Not on file  . Alcohol Use: Yes  . Drug Use: Not on file  . Sexual Activity: Yes   Other Topics Concern  . Not on file   Social History Narrative  . No narrative on file    Family History  Problem Relation Age of Onset  . Hyperlipidemia Mother   . Hypertension Father     No Known Allergies  Current Outpatient Prescriptions on File Prior to Visit  Medication Sig Dispense Refill  . Cholecalciferol (VITAMIN D) 2000 UNITS CAPS Take 1 capsule by mouth daily.        . Multiple Vitamin (MULTIVITAMIN) capsule Take 1 capsule by mouth daily.         No current facility-administered medications on file prior to visit.    BP 124/78  Pulse 90  Temp(Src) 98 F (36.7 C) (Oral)  Ht 4' 11.75" (1.518 m)  Wt 122 lb 8 oz (55.566 kg)  BMI 24.11 kg/m2  SpO2 98%  Objective:   Physical Exam  General:  Well-developed,well-nourished,in no acute distress; alert,appropriate and cooperative throughout examination Head:  normocephalic and atraumatic.   Eyes:  vision grossly intact, pupils equal, pupils round, and pupils reactive to light.   Ears:  R ear normal and L ear normal.   Nose:  Boggy turbinates, sinuses NTTP, no signs of obstruction.   Mouth:  good dentition.   Neck:  No deformities, masses, or tenderness noted. Breasts:  Deferred, has OBGYN Lungs:  Normal respiratory effort, chest expands symmetrically. Lungs are clear to auscultation, no crackles or wheezes. Heart:  Normal rate and regular rhythm. 2/6 murmur. Abdomen:  Bowel sounds positive,abdomen soft and non-tender without masses, organomegaly or hernias noted. Genitalia:  Pelvic Exam:  Deferred- has  GYN Msk:  No deformity or scoliosis noted of thoracic or lumbar spine.   Extremities:  No clubbing, cyanosis, edema, or deformity noted with normal full range of motion of all joints.   Neurologic:  alert & oriented X3 and gait normal.   Skin:  Intact without suspicious lesions or rashes Cervical Nodes:  No lymphadenopathy noted Axillary Nodes:  No palpable lymphadenopathy Psych:  Cognition and judgment appear intact. Alert and cooperative with normal attention span and concentration. No apparent delusions, illusions, hallucinations         Assessment & Plan:

## 2013-12-26 ENCOUNTER — Other Ambulatory Visit: Payer: Self-pay | Admitting: *Deleted

## 2013-12-26 MED ORDER — NORGESTREL-ETHINYL ESTRADIOL 0.3-30 MG-MCG PO TABS: 1.0000 | ORAL_TABLET | Freq: Every day | ORAL | Status: DC

## 2013-12-26 NOTE — Telephone Encounter (Signed)
Fax received indicating pts insurance does not cover premarin 0.3mg  tab, request alt medication be sent that is covered. New Rx sent to requested pharmacy per Dr Deborra Medina

## 2015-01-07 ENCOUNTER — Other Ambulatory Visit: Payer: Self-pay | Admitting: Family Medicine

## 2015-03-17 ENCOUNTER — Other Ambulatory Visit: Payer: Self-pay | Admitting: Family Medicine

## 2015-03-17 DIAGNOSIS — Z0000 Encounter for general adult medical examination without abnormal findings: Secondary | ICD-10-CM

## 2015-03-18 ENCOUNTER — Other Ambulatory Visit (INDEPENDENT_AMBULATORY_CARE_PROVIDER_SITE_OTHER): Payer: 59

## 2015-03-18 DIAGNOSIS — Z0000 Encounter for general adult medical examination without abnormal findings: Secondary | ICD-10-CM | POA: Diagnosis not present

## 2015-03-18 LAB — CBC WITH DIFFERENTIAL/PLATELET
Basophils Absolute: 0 10*3/uL (ref 0.0–0.1)
Basophils Relative: 0.5 % (ref 0.0–3.0)
EOS ABS: 0.1 10*3/uL (ref 0.0–0.7)
Eosinophils Relative: 2.5 % (ref 0.0–5.0)
HCT: 39.5 % (ref 36.0–46.0)
HEMOGLOBIN: 13.4 g/dL (ref 12.0–15.0)
LYMPHS ABS: 1.8 10*3/uL (ref 0.7–4.0)
Lymphocytes Relative: 35.6 % (ref 12.0–46.0)
MCHC: 33.9 g/dL (ref 30.0–36.0)
MCV: 90.8 fl (ref 78.0–100.0)
MONO ABS: 0.5 10*3/uL (ref 0.1–1.0)
Monocytes Relative: 10.7 % (ref 3.0–12.0)
NEUTROS PCT: 50.7 % (ref 43.0–77.0)
Neutro Abs: 2.5 10*3/uL (ref 1.4–7.7)
Platelets: 261 10*3/uL (ref 150.0–400.0)
RBC: 4.35 Mil/uL (ref 3.87–5.11)
RDW: 13 % (ref 11.5–15.5)
WBC: 5 10*3/uL (ref 4.0–10.5)

## 2015-03-18 LAB — LIPID PANEL
CHOL/HDL RATIO: 3
Cholesterol: 208 mg/dL — ABNORMAL HIGH (ref 0–200)
HDL: 74.2 mg/dL (ref >39.00)
LDL Cholesterol: 125 mg/dL — ABNORMAL HIGH (ref 0–99)
NONHDL: 134.15
Triglycerides: 44 mg/dL (ref 0.0–149.0)
VLDL: 8.8 mg/dL (ref 0.0–40.0)

## 2015-03-18 LAB — COMPREHENSIVE METABOLIC PANEL
ALBUMIN: 4 g/dL (ref 3.5–5.2)
ALK PHOS: 36 U/L — AB (ref 39–117)
ALT: 21 U/L (ref 0–35)
AST: 21 U/L (ref 0–37)
BUN: 12 mg/dL (ref 6–23)
CHLORIDE: 105 meq/L (ref 96–112)
CO2: 28 mEq/L (ref 19–32)
CREATININE: 0.63 mg/dL (ref 0.40–1.20)
Calcium: 9.4 mg/dL (ref 8.4–10.5)
GFR: 129.12 mL/min (ref >60.00)
GLUCOSE: 98 mg/dL (ref 70–99)
POTASSIUM: 3.8 meq/L (ref 3.5–5.1)
SODIUM: 140 meq/L (ref 135–145)
TOTAL PROTEIN: 6.8 g/dL (ref 6.0–8.3)
Total Bilirubin: 0.4 mg/dL (ref 0.2–1.2)

## 2015-03-18 LAB — TSH: TSH: 1.77 u[IU]/mL (ref 0.35–4.50)

## 2015-03-27 ENCOUNTER — Encounter: Payer: Self-pay | Admitting: Family Medicine

## 2015-03-27 ENCOUNTER — Ambulatory Visit (INDEPENDENT_AMBULATORY_CARE_PROVIDER_SITE_OTHER): Payer: 59 | Admitting: Family Medicine

## 2015-03-27 VITALS — BP 132/86 | HR 76 | Temp 98.0°F | Ht 60.0 in | Wt 124.2 lb

## 2015-03-27 DIAGNOSIS — N951 Menopausal and female climacteric states: Secondary | ICD-10-CM | POA: Diagnosis not present

## 2015-03-27 DIAGNOSIS — Z0000 Encounter for general adult medical examination without abnormal findings: Secondary | ICD-10-CM

## 2015-03-27 MED ORDER — ESTROGENS, CONJUGATED 0.625 MG/GM VA CREA: 1.0000 | TOPICAL_CREAM | Freq: Every day | VAGINAL | Status: DC

## 2015-03-27 NOTE — Assessment & Plan Note (Signed)
Reviewed preventive care protocols, scheduled due services, and updated immunizations Discussed nutrition, exercise, diet, and healthy lifestyle.  Influenza vaccine deferred- she plans to get it at work.

## 2015-03-27 NOTE — Assessment & Plan Note (Signed)
Vaginal dryness deteriorated. She would like to stop OCP and restart topical estrogen. eRx sent for premarin cream as requested and she will follow up with her OBGYN next month.

## 2015-03-27 NOTE — Progress Notes (Signed)
Pre visit review using our clinic review tool, if applicable. No additional management support is needed unless otherwise documented below in the visit note. 

## 2015-03-27 NOTE — Progress Notes (Signed)
Subjective:    Patient ID: Patricia Dunlap, female    DOB: [birth year], 49 y.o.   MRN: [id number]  HPI 49 yo pleasant female here for CPX and follow up of chronic medical conditions.  Has OBGYN, Dr. Lilyan Punt to see her in 03/2015- mammogram scheduled to be done at that time.  S/p hysterectomy for fibroid tumors in 2005.  DEXA 08/08/2012- normal.  Hot flashes improved with OCP.  Wants to try premarin cream again since she is still having vaginal dryness.     Wt Readings from Last 3 Encounters:  03/27/15 124 lb 4 oz (56.359 kg)  12/06/13 122 lb 8 oz (55.566 kg)  09/13/13 115 lb (52.164 kg)  Going to the gym 3-4 days per week.  Cut back sugar and bread.  HLD- LDL much improved with diet and exercise.  Lab Results  Component Value Date   CHOL 208* 03/18/2015   HDL 74.20 03/18/2015   LDLCALC 125* 03/18/2015   LDLDIRECT 115.7 09/16/2010   TRIG 44.0 03/18/2015   CHOLHDL 3 03/18/2015   Lab Results  Component Value Date   CREATININE 0.63 03/18/2015   Lab Results  Component Value Date   NA 140 03/18/2015   K 3.8 03/18/2015   CL 105 03/18/2015   CO2 28 03/18/2015   Lab Results  Component Value Date   WBC 5.0 03/18/2015   HGB 13.4 03/18/2015   HCT 39.5 03/18/2015   MCV 90.8 03/18/2015   PLT 261.0 03/18/2015    Heart murmur- diagnosed in her 98s.  No h/o palpitations, CP, SOB or LE edema.  Very physically active without difficulty. Had an echo in 2009-neg.   Review of Systems  Constitutional: Negative for fever, chills and unexpected weight change.  HENT: Negative for congestion, postnasal drip and sneezing.   Eyes: Negative for itching.  Respiratory: Negative for cough, shortness of breath and wheezing.   Cardiovascular: Negative.   Gastrointestinal: Negative.   Endocrine: Negative.   Genitourinary: Negative.  Negative for dysuria and vaginal pain.  Musculoskeletal: Negative.   Skin: Negative.   Allergic/Immunologic: Negative.   Neurological:  Negative for dizziness.  Psychiatric/Behavioral: Negative for dysphoric mood.   Past Medical History  Diagnosis Date  . Allergy   . Heart murmur     Past Surgical History  Procedure Laterality Date  . Abdominal hysterectomy      Social History   Social History  . Marital Status: Married    Spouse Name: N/A  . Number of Children: N/A  . Years of Education: N/A   Occupational History  . Not on file.   Social History Main Topics  . Smoking status: Never Smoker   . Smokeless tobacco: Never Used  . Alcohol Use: 0.0 oz/week    0 Standard drinks or equivalent per week  . Drug Use: Not on file  . Sexual Activity: Yes   Other Topics Concern  . Not on file   Social History Narrative    Family History  Problem Relation Age of Onset  . Hyperlipidemia Mother   . Hypertension Father     No Known Allergies  Current Outpatient Prescriptions on File Prior to Visit  Medication Sig Dispense Refill  . Cholecalciferol (VITAMIN D) 2000 UNITS CAPS Take 1 capsule by mouth daily.      . CRYSELLE-28 0.3-30 MG-MCG tablet TAKE 1 TABLET BY MOUTH DAILY. 28 tablet 11  . Multiple Vitamin (MULTIVITAMIN) capsule Take 1 capsule by mouth daily.      Marland Kitchen estrogens, conjugated, (  PREMARIN) 0.3 MG tablet Take 1 tablet (0.3 mg total) by mouth daily. (Patient not taking: Reported on 03/27/2015) 30 tablet 3   No current facility-administered medications on file prior to visit.    BP 132/86 mmHg  Pulse 76  Temp(Src) 98 F (36.7 C) (Oral)  Ht 5' (1.524 m)  Wt 124 lb 4 oz (56.359 kg)  BMI 24.27 kg/m2   Wt Readings from Last 3 Encounters:  03/27/15 124 lb 4 oz (56.359 kg)  12/06/13 122 lb 8 oz (55.566 kg)  09/13/13 115 lb (52.164 kg)       Objective:   Physical Exam  General:  Well-developed,well-nourished,in no acute distress; alert,appropriate and cooperative throughout examination Head:  normocephalic and atraumatic.   Eyes:  vision grossly intact, pupils equal, pupils round, and  pupils reactive to light.   Ears:  R ear normal and L ear normal.   Nose:  Boggy turbinates, sinuses NTTP, no signs of obstruction.   Mouth:  good dentition.   Neck:  No deformities, masses, or tenderness noted. Breasts:  Deferred, has OBGYN Lungs:  Normal respiratory effort, chest expands symmetrically. Lungs are clear to auscultation, no crackles or wheezes. Heart:  Normal rate and regular rhythm. 2/6 murmur. Abdomen:  Bowel sounds positive,abdomen soft and non-tender without masses, organomegaly or hernias noted. Genitalia:  Pelvic Exam:  Deferred- has GYN Msk:  No deformity or scoliosis noted of thoracic or lumbar spine.   Extremities:  No clubbing, cyanosis, edema, or deformity noted with normal full range of motion of all joints.   Neurologic:  alert & oriented X3 and gait normal.   Skin:  Intact without suspicious lesions or rashes Cervical Nodes:  No lymphadenopathy noted Axillary Nodes:  No palpable lymphadenopathy Psych:  Cognition and judgment appear intact. Alert and cooperative with normal attention span and concentration. No apparent delusions, illusions, hallucinations         Assessment & Plan:

## 2016-08-31 ENCOUNTER — Other Ambulatory Visit: Payer: Self-pay | Admitting: Family Medicine

## 2016-08-31 DIAGNOSIS — Z0000 Encounter for general adult medical examination without abnormal findings: Secondary | ICD-10-CM

## 2016-09-03 ENCOUNTER — Other Ambulatory Visit (INDEPENDENT_AMBULATORY_CARE_PROVIDER_SITE_OTHER): Payer: Commercial Managed Care - HMO

## 2016-09-03 DIAGNOSIS — Z0000 Encounter for general adult medical examination without abnormal findings: Secondary | ICD-10-CM | POA: Diagnosis not present

## 2016-09-03 LAB — CBC WITH DIFFERENTIAL/PLATELET
BASOS PCT: 0.9 % (ref 0.0–3.0)
Basophils Absolute: 0 10*3/uL (ref 0.0–0.1)
EOS ABS: 0.3 10*3/uL (ref 0.0–0.7)
EOS PCT: 6.2 % — AB (ref 0.0–5.0)
HEMATOCRIT: 38 % (ref 36.0–46.0)
HEMOGLOBIN: 12.7 g/dL (ref 12.0–15.0)
LYMPHS PCT: 39 % (ref 12.0–46.0)
Lymphs Abs: 2.1 10*3/uL (ref 0.7–4.0)
MCHC: 33.4 g/dL (ref 30.0–36.0)
MCV: 89.2 fl (ref 78.0–100.0)
MONOS PCT: 9.7 % (ref 3.0–12.0)
Monocytes Absolute: 0.5 10*3/uL (ref 0.1–1.0)
Neutro Abs: 2.3 10*3/uL (ref 1.4–7.7)
Neutrophils Relative %: 44.2 % (ref 43.0–77.0)
Platelets: 297 10*3/uL (ref 150.0–400.0)
RBC: 4.27 Mil/uL (ref 3.87–5.11)
RDW: 13.1 % (ref 11.5–15.5)
WBC: 5.3 10*3/uL (ref 4.0–10.5)

## 2016-09-03 LAB — COMPREHENSIVE METABOLIC PANEL
ALBUMIN: 4.2 g/dL (ref 3.5–5.2)
ALT: 27 U/L (ref 0–35)
AST: 24 U/L (ref 0–37)
Alkaline Phosphatase: 53 U/L (ref 39–117)
BILIRUBIN TOTAL: 0.3 mg/dL (ref 0.2–1.2)
BUN: 12 mg/dL (ref 6–23)
CALCIUM: 9.9 mg/dL (ref 8.4–10.5)
CHLORIDE: 107 meq/L (ref 96–112)
CO2: 28 mEq/L (ref 19–32)
CREATININE: 0.7 mg/dL (ref 0.40–1.20)
GFR: 113.65 mL/min (ref >60.00)
Glucose, Bld: 92 mg/dL (ref 70–99)
Potassium: 4.2 mEq/L (ref 3.5–5.1)
Sodium: 141 mEq/L (ref 135–145)
Total Protein: 6.8 g/dL (ref 6.0–8.3)

## 2016-09-03 LAB — LIPID PANEL
CHOLESTEROL: 223 mg/dL — AB (ref 0–200)
HDL: 70.9 mg/dL (ref >39.00)
LDL Cholesterol: 146 mg/dL — ABNORMAL HIGH (ref 0–99)
NonHDL: 152.52
TRIGLYCERIDES: 35 mg/dL (ref 0.0–149.0)
Total CHOL/HDL Ratio: 3
VLDL: 7 mg/dL (ref 0.0–40.0)

## 2016-09-03 LAB — TSH: TSH: 1.17 u[IU]/mL (ref 0.35–4.50)

## 2016-09-08 ENCOUNTER — Encounter: Payer: Self-pay | Admitting: Internal Medicine

## 2016-09-08 ENCOUNTER — Encounter: Payer: Self-pay | Admitting: Family Medicine

## 2016-09-08 ENCOUNTER — Ambulatory Visit (INDEPENDENT_AMBULATORY_CARE_PROVIDER_SITE_OTHER): Payer: Commercial Managed Care - HMO | Admitting: Family Medicine

## 2016-09-08 VITALS — BP 128/84 | HR 79 | Temp 98.0°F | Ht 60.25 in | Wt 124.0 lb

## 2016-09-08 DIAGNOSIS — Z1211 Encounter for screening for malignant neoplasm of colon: Secondary | ICD-10-CM | POA: Diagnosis not present

## 2016-09-08 DIAGNOSIS — J3089 Other allergic rhinitis: Secondary | ICD-10-CM | POA: Diagnosis not present

## 2016-09-08 DIAGNOSIS — Z0000 Encounter for general adult medical examination without abnormal findings: Secondary | ICD-10-CM

## 2016-09-08 DIAGNOSIS — N951 Menopausal and female climacteric states: Secondary | ICD-10-CM | POA: Diagnosis not present

## 2016-09-08 MED ORDER — ESTRADIOL 0.5 MG PO TABS: 0.5000 mg | ORAL_TABLET | Freq: Every day | ORAL | 3 refills | Status: DC

## 2016-09-08 NOTE — Progress Notes (Signed)
Pre visit review using our clinic review tool, if applicable. No additional management support is needed unless otherwise documented below in the visit note. 

## 2016-09-08 NOTE — Assessment & Plan Note (Signed)
Deteriorated. She would like to restart hrt.  Discussed risks and benefits of HRT. She would like to try oral estrace. eRx sent.

## 2016-09-08 NOTE — Assessment & Plan Note (Signed)
Reviewed preventive care protocols, scheduled due services, and updated immunizations Discussed nutrition, exercise, diet, and healthy lifestyle.  

## 2016-09-08 NOTE — Progress Notes (Signed)
Subjective:   Patient ID: Patricia Dunlap, female    DOB: 1966-01-21, 51 y.o.   MRN: [id number]  Patricia Dunlap is a pleasant 51 y.o. year old female who presents to clinic today with Annual Exam (Discuss Colonoscopy. Mammogram scheduled in May) and Back Pain (Pain with radiculopathy.)  on 09/08/2016  HPI:   Has OBGYN, Dr. Garwin Brothers- mammogram already scheduled for 10/2016.  S/p hysterectomy for fibroid tumors in 2005.  Has never had a colonoscopy  She has been using premarin cream for insomnia and vaginal dryness.   Lab Results  Component Value Date   CHOL 223 (H) 09/03/2016   HDL 70.90 09/03/2016   LDLCALC 146 (H) 09/03/2016   LDLDIRECT 115.7 09/16/2010   TRIG 35.0 09/03/2016   CHOLHDL 3 09/03/2016   Lab Results  Component Value Date   ALT 27 09/03/2016   AST 24 09/03/2016   ALKPHOS 53 09/03/2016   BILITOT 0.3 09/03/2016   Lab Results  Component Value Date   NA 141 09/03/2016   K 4.2 09/03/2016   CL 107 09/03/2016   CO2 28 09/03/2016   Lab Results  Component Value Date   WBC 5.3 09/03/2016   HGB 12.7 09/03/2016   HCT 38.0 09/03/2016   MCV 89.2 09/03/2016   PLT 297.0 09/03/2016   Lab Results  Component Value Date   TSH 1.17 09/03/2016   Current Outpatient Prescriptions on File Prior to Visit  Medication Sig Dispense Refill  . loratadine (CLARITIN) 10 MG tablet Take 10 mg by mouth daily as needed for allergies.    . Multiple Vitamin (MULTIVITAMIN) capsule Take 1 capsule by mouth daily.       No current facility-administered medications on file prior to visit.     No Known Allergies  Past Medical History:  Diagnosis Date  . Allergy   . Heart murmur     Past Surgical History:  Procedure Laterality Date  . ABDOMINAL HYSTERECTOMY      Family History  Problem Relation Age of Onset  . Hyperlipidemia Mother   . Hypertension Father     Social History   Social History  . Marital status: Married    Spouse name: N/A  . Number of children:  N/A  . Years of education: N/A   Occupational History  . Not on file.   Social History Main Topics  . Smoking status: Never Smoker  . Smokeless tobacco: Never Used  . Alcohol use 0.0 oz/week  . Drug use: Unknown  . Sexual activity: Yes   Other Topics Concern  . Not on file   Social History Narrative  . No narrative on file   The PMH, PSH, Social History, Family History, Medications, and allergies have been reviewed in [facility], and have been updated if relevant.   Review of Systems  Constitutional: Negative.   HENT: Negative.   Eyes: Negative.   Respiratory: Negative.   Cardiovascular: Negative.   Gastrointestinal: Negative.   Endocrine: Negative.   Genitourinary: Negative.        +vaginal dryness  Allergic/Immunologic: Negative.   Neurological: Negative.   Psychiatric/Behavioral: Positive for sleep disturbance.  All other systems reviewed and are negative.      Objective:    BP 128/84 (BP Location: Left Arm, Patient Position: Sitting, Cuff Size: Normal)   Pulse 79   Temp 98 F (36.7 C) (Oral)   Ht 5' 0.25" (1.53 m)   Wt 124 lb (56.2 kg)   SpO2 99%   BMI 24.02  kg/m    Physical Exam   General:  Well-developed,well-nourished,in no acute distress; alert,appropriate and cooperative throughout examination Head:  normocephalic and atraumatic.   Eyes:  vision grossly intact, PERRL Ears:  R ear normal and L ear normal externally, TMs clear bilaterally Nose:  no external deformity.   Mouth:  good dentition.   Neck:  No deformities, masses, or tenderness noted. Lungs:  Normal respiratory effort, chest expands symmetrically. Lungs are clear to auscultation, no crackles or wheezes. Heart:  Normal rate and regular rhythm. S1 and S2 normal without gallop, murmur, click, rub or other extra sounds. Abdomen:  Bowel sounds positive,abdomen soft and non-tender without masses, organomegaly or hernias noted. Msk:  No deformity or scoliosis noted of thoracic or lumbar spine.     Extremities:  No clubbing, cyanosis, edema, or deformity noted with normal full range of motion of all joints.   Neurologic:  alert & oriented X3 and gait normal.   Skin:  Intact without suspicious lesions or rashes Cervical Nodes:  No lymphadenopathy noted Psych:  Cognition and judgment appear intact. Alert and cooperative with normal attention span and concentration. No apparent delusions, illusions, hallucinations        Assessment & Plan:   Routine general medical examination at a health care facility  Symptoms, such as flushing, sleeplessness, headache, lack of concentration, associated with the menopause  Allergic rhinitis due to other allergic trigger, unspecified chronicity, unspecified seasonality No Follow-up on file.

## 2016-10-21 ENCOUNTER — Encounter: Payer: Self-pay | Admitting: Internal Medicine

## 2016-10-21 ENCOUNTER — Ambulatory Visit (AMBULATORY_SURGERY_CENTER): Payer: Self-pay

## 2016-10-21 VITALS — Ht 60.0 in | Wt 124.8 lb

## 2016-10-21 DIAGNOSIS — Z1211 Encounter for screening for malignant neoplasm of colon: Secondary | ICD-10-CM

## 2016-10-21 MED ORDER — SUPREP BOWEL PREP KIT 17.5-3.13-1.6 GM/177ML PO SOLN: 1.0000 | Freq: Once | ORAL | 0 refills | Status: AC

## 2016-10-21 NOTE — Progress Notes (Signed)
No diet meds No home oxygen No allergies to eggs or soy No past problems with anesthesia  Registered for emmi

## 2016-11-03 DIAGNOSIS — J302 Other seasonal allergic rhinitis: Secondary | ICD-10-CM | POA: Diagnosis not present

## 2016-11-04 ENCOUNTER — Ambulatory Visit (AMBULATORY_SURGERY_CENTER): Payer: Commercial Managed Care - HMO | Admitting: Internal Medicine

## 2016-11-04 ENCOUNTER — Encounter: Payer: Self-pay | Admitting: Internal Medicine

## 2016-11-04 VITALS — BP 130/88 | HR 75 | Temp 97.7°F | Resp 12 | Ht 60.0 in | Wt 124.0 lb

## 2016-11-04 DIAGNOSIS — Z1211 Encounter for screening for malignant neoplasm of colon: Secondary | ICD-10-CM

## 2016-11-04 DIAGNOSIS — D122 Benign neoplasm of ascending colon: Secondary | ICD-10-CM

## 2016-11-04 DIAGNOSIS — Z1212 Encounter for screening for malignant neoplasm of rectum: Secondary | ICD-10-CM

## 2016-11-04 DIAGNOSIS — D123 Benign neoplasm of transverse colon: Secondary | ICD-10-CM

## 2016-11-04 HISTORY — PX: COLONOSCOPY: SHX174

## 2016-11-04 MED ORDER — SODIUM CHLORIDE 0.9 % IV SOLN: 500.0000 mL | INTRAVENOUS | Status: DC

## 2016-11-04 NOTE — Patient Instructions (Signed)
YOU HAD AN ENDOSCOPIC PROCEDURE TODAY AT THE  ENDOSCOPY CENTER:   Refer to the procedure report that was given to you for any specific questions about what was found during the examination.  If the procedure report does not answer your questions, please call your gastroenterologist to clarify.  If you requested that your care partner not be given the details of your procedure findings, then the procedure report has been included in a sealed envelope for you to review at your convenience later.  YOU SHOULD EXPECT: Some feelings of bloating in the abdomen. Passage of more gas than usual.  Walking can help get rid of the air that was put into your GI tract during the procedure and reduce the bloating. If you had a lower endoscopy (such as a colonoscopy or flexible sigmoidoscopy) you may notice spotting of blood in your stool or on the toilet paper. If you underwent a bowel prep for your procedure, you may not have a normal bowel movement for a few days.  Please Note:  You might notice some irritation and congestion in your nose or some drainage.  This is from the oxygen used during your procedure.  There is no need for concern and it should clear up in a day or so.  SYMPTOMS TO REPORT IMMEDIATELY:   Following lower endoscopy (colonoscopy or flexible sigmoidoscopy):  Excessive amounts of blood in the stool  Significant tenderness or worsening of abdominal pains  Swelling of the abdomen that is new, acute  Fever of 100F or higher    For urgent or emergent issues, a gastroenterologist can be reached at any hour by calling [phone].   DIET:  We do recommend a small meal at first, but then you may proceed to your regular diet.  Drink plenty of fluids but you should avoid alcoholic beverages for 24 hours.  ACTIVITY:  You should plan to take it easy for the rest of today and you should NOT DRIVE or use heavy machinery until tomorrow (because of the sedation medicines used during the test).     FOLLOW UP: Our staff will call the number listed on your records the next business day following your procedure to check on you and address any questions or concerns that you may have regarding the information given to you following your procedure. If we do not reach you, we will leave a message.  However, if you are feeling well and you are not experiencing any problems, there is no need to return our call.  We will assume that you have returned to your regular daily activities without incident.  If any biopsies were taken you will be contacted by phone or by letter within the next 1-3 weeks.  Please call us at [phone] if you have not heard about the biopsies in 3 weeks.    SIGNATURES/CONFIDENTIALITY: You and/or your care partner have signed paperwork which will be entered into your electronic medical record.  These signatures attest to the fact that that the information above on your After Visit Summary has been reviewed and is understood.  Full responsibility of the confidentiality of this discharge information lies with you and/or your care-partner.   Resume medications. Information given on polyps. 

## 2016-11-04 NOTE — Progress Notes (Signed)
Called to room to assist during endoscopic procedure.  Patient ID and intended procedure confirmed with present staff. Received instructions for my participation in the procedure from the performing physician.  

## 2016-11-04 NOTE — Progress Notes (Signed)
Pt's states no medical or surgical changes since previsit or office visit. 

## 2016-11-04 NOTE — Progress Notes (Signed)
Report to PACU, RN, vss, BBS= Clear.  

## 2016-11-04 NOTE — Op Note (Signed)
[facility] Patient Name: Patricia Dunlap Procedure Date: 11/04/2016 11:47 AM MRN: [id number] Endoscopist: Jerene Bears , MD Age: 51 Referring MD:  Date of Birth: Nov 05, 1965 Gender: Female Account #: [id number] Procedure:                Colonoscopy Indications:              Screening for colorectal malignant neoplasm, This                            is the patient's first colonoscopy Medicines:                Monitored Anesthesia Care Procedure:                Pre-Anesthesia Assessment:                           - Prior to the procedure, a History and Physical                            was performed, and patient medications and                            allergies were reviewed. The patient's tolerance of                            previous anesthesia was also reviewed. The risks                            and benefits of the procedure and the sedation                            options and risks were discussed with the patient.                            All questions were answered, and informed consent                            was obtained. Prior Anticoagulants: The patient has                            taken no previous anticoagulant or antiplatelet                            agents. ASA Grade Assessment: II - A patient with                            mild systemic disease. After reviewing the risks                            and benefits, the patient was deemed in                            satisfactory condition to undergo the procedure.  After obtaining informed consent, the colonoscope                            was passed under direct vision. Throughout the                            procedure, the patient's blood pressure, pulse, and                            oxygen saturations were monitored continuously. The                            Model PCF-H190DL [id number]) scope was introduced                            through the anus and  advanced to the the cecum,                            identified by appendiceal orifice and ileocecal                            valve. The colonoscopy was performed without                            difficulty. The patient tolerated the procedure                            well. The quality of the bowel preparation was                            good. The ileocecal valve, appendiceal orifice, and                            rectum were photographed. Scope In: 11:51:36 AM Scope Out: 30:07:62 PM Scope Withdrawal Time: 0 hours 11 minutes 46 seconds  Total Procedure Duration: 0 hours 16 minutes 41 seconds  Findings:                 The digital rectal exam was normal.                           A 6 mm polyp was found in the ascending colon. The                            polyp was sessile. The polyp was removed with a                            cold snare. Resection and retrieval were complete.                           A 5 mm polyp was found in the hepatic flexure. The                            polyp was sessile.  The polyp was removed with a                            cold snare. Resection and retrieval were complete.                           The exam was otherwise without abnormality on                            direct and retroflexion views. Complications:            No immediate complications. Estimated Blood Loss:     Estimated blood loss was minimal. Impression:               - One 6 mm polyp in the ascending colon, removed                            with a cold snare. Resected and retrieved.                           - One 5 mm polyp at the hepatic flexure, removed                            with a cold snare. Resected and retrieved.                           - The examination was otherwise normal on direct                            and retroflexion views. Recommendation:           - Patient has a contact number available for                            emergencies. The signs and  symptoms of potential                            delayed complications were discussed with the                            patient. Return to normal activities tomorrow.                            Written discharge instructions were provided to the                            patient.                           - Resume previous diet.                           - Continue present medications.                           - Await pathology results.                           -  Repeat colonoscopy is recommended. The                            colonoscopy date will be determined after pathology                            results from today's exam become available for                            review. Jerene Bears, MD 11/04/2016 12:10:48 PM This report has been signed electronically.

## 2016-11-05 ENCOUNTER — Telehealth: Payer: Self-pay

## 2016-11-05 NOTE — Telephone Encounter (Signed)
  Follow up Call-  Call back number 11/04/2016  Post procedure Call Back phone  # [phone]  Permission to leave phone message No  Some recent data might be hidden     Patient questions:  Do you have a fever, pain , or abdominal swelling? No. Pain Score  0 *  Have you tolerated food without any problems? Yes.    Have you been able to return to your normal activities? Yes.    Do you have any questions about your discharge instructions: Diet   No. Medications  No. Follow up visit  No.  Do you have questions or concerns about your Care? No.  Actions: * If pain score is 4 or above: No action needed, pain <4.

## 2016-11-11 ENCOUNTER — Encounter: Payer: Self-pay | Admitting: Internal Medicine

## 2016-11-17 DIAGNOSIS — Z01419 Encounter for gynecological examination (general) (routine) without abnormal findings: Secondary | ICD-10-CM | POA: Diagnosis not present

## 2016-11-17 DIAGNOSIS — Z1231 Encounter for screening mammogram for malignant neoplasm of breast: Secondary | ICD-10-CM | POA: Diagnosis not present

## 2016-11-17 DIAGNOSIS — Z6824 Body mass index (BMI) 24.0-24.9, adult: Secondary | ICD-10-CM | POA: Diagnosis not present

## 2017-02-06 ENCOUNTER — Other Ambulatory Visit: Payer: Self-pay | Admitting: Family Medicine

## 2017-03-14 DIAGNOSIS — H04123 Dry eye syndrome of bilateral lacrimal glands: Secondary | ICD-10-CM | POA: Diagnosis not present

## 2017-03-14 DIAGNOSIS — H40033 Anatomical narrow angle, bilateral: Secondary | ICD-10-CM | POA: Diagnosis not present

## 2017-03-24 DIAGNOSIS — L700 Acne vulgaris: Secondary | ICD-10-CM | POA: Diagnosis not present

## 2017-03-24 DIAGNOSIS — D225 Melanocytic nevi of trunk: Secondary | ICD-10-CM | POA: Diagnosis not present

## 2017-03-24 DIAGNOSIS — L816 Other disorders of diminished melanin formation: Secondary | ICD-10-CM | POA: Diagnosis not present

## 2017-05-27 DIAGNOSIS — L700 Acne vulgaris: Secondary | ICD-10-CM | POA: Diagnosis not present

## 2017-08-19 ENCOUNTER — Other Ambulatory Visit: Payer: Self-pay | Admitting: Family Medicine

## 2017-11-28 ENCOUNTER — Encounter: Payer: Self-pay | Admitting: Family Medicine

## 2017-11-28 ENCOUNTER — Ambulatory Visit (INDEPENDENT_AMBULATORY_CARE_PROVIDER_SITE_OTHER): Payer: 59 | Admitting: Family Medicine

## 2017-11-28 VITALS — BP 112/74 | HR 71 | Temp 98.4°F | Ht 60.0 in | Wt 126.0 lb

## 2017-11-28 DIAGNOSIS — N952 Postmenopausal atrophic vaginitis: Secondary | ICD-10-CM | POA: Diagnosis not present

## 2017-11-28 DIAGNOSIS — Z23 Encounter for immunization: Secondary | ICD-10-CM | POA: Diagnosis not present

## 2017-11-28 DIAGNOSIS — G4700 Insomnia, unspecified: Secondary | ICD-10-CM | POA: Diagnosis not present

## 2017-11-28 DIAGNOSIS — L708 Other acne: Secondary | ICD-10-CM | POA: Diagnosis not present

## 2017-11-28 DIAGNOSIS — L709 Acne, unspecified: Secondary | ICD-10-CM | POA: Insufficient documentation

## 2017-11-28 DIAGNOSIS — E785 Hyperlipidemia, unspecified: Secondary | ICD-10-CM

## 2017-11-28 DIAGNOSIS — Z0000 Encounter for general adult medical examination without abnormal findings: Secondary | ICD-10-CM | POA: Diagnosis not present

## 2017-11-28 LAB — COMPREHENSIVE METABOLIC PANEL
ALT: 20 U/L (ref 0–35)
AST: 18 U/L (ref 0–37)
Albumin: 4.3 g/dL (ref 3.5–5.2)
Alkaline Phosphatase: 48 U/L (ref 39–117)
BILIRUBIN TOTAL: 0.4 mg/dL (ref 0.2–1.2)
BUN: 13 mg/dL (ref 6–23)
CALCIUM: 9.4 mg/dL (ref 8.4–10.5)
CHLORIDE: 105 meq/L (ref 96–112)
CO2: 30 meq/L (ref 19–32)
Creatinine, Ser: 0.58 mg/dL (ref 0.40–1.20)
GFR: 140.51 mL/min (ref >60.00)
GLUCOSE: 89 mg/dL (ref 70–99)
Potassium: 4 mEq/L (ref 3.5–5.1)
Sodium: 141 mEq/L (ref 135–145)
Total Protein: 6.5 g/dL (ref 6.0–8.3)

## 2017-11-28 LAB — LIPID PANEL
CHOL/HDL RATIO: 3
Cholesterol: 244 mg/dL — ABNORMAL HIGH (ref 0–200)
HDL: 75.2 mg/dL (ref >39.00)
LDL Cholesterol: 159 mg/dL — ABNORMAL HIGH (ref 0–99)
NONHDL: 169.11
TRIGLYCERIDES: 52 mg/dL (ref 0.0–149.0)
VLDL: 10.4 mg/dL (ref 0.0–40.0)

## 2017-11-28 LAB — CBC WITH DIFFERENTIAL/PLATELET
BASOS ABS: 0.1 10*3/uL (ref 0.0–0.1)
Basophils Relative: 1.1 % (ref 0.0–3.0)
Eosinophils Absolute: 0.1 10*3/uL (ref 0.0–0.7)
Eosinophils Relative: 2.1 % (ref 0.0–5.0)
HEMATOCRIT: 37.5 % (ref 36.0–46.0)
Hemoglobin: 12.8 g/dL (ref 12.0–15.0)
Lymphocytes Relative: 33.9 % (ref 12.0–46.0)
Lymphs Abs: 1.5 10*3/uL (ref 0.7–4.0)
MCHC: 34 g/dL (ref 30.0–36.0)
MCV: 90.9 fl (ref 78.0–100.0)
MONOS PCT: 7.8 % (ref 3.0–12.0)
Monocytes Absolute: 0.4 10*3/uL (ref 0.1–1.0)
NEUTROS ABS: 2.5 10*3/uL (ref 1.4–7.7)
NEUTROS PCT: 55.1 % (ref 43.0–77.0)
Platelets: 233 10*3/uL (ref 150.0–400.0)
RBC: 4.13 Mil/uL (ref 3.87–5.11)
RDW: 12.7 % (ref 11.5–15.5)
WBC: 4.6 10*3/uL (ref 4.0–10.5)

## 2017-11-28 LAB — TSH: TSH: 0.47 u[IU]/mL (ref 0.35–4.50)

## 2017-11-28 MED ORDER — ESTRADIOL 10 MCG VA TABS: ORAL_TABLET | VAGINAL | 3 refills | Status: DC

## 2017-11-28 MED ORDER — SPIRONOLACTONE 50 MG PO TABS: 50.0000 mg | ORAL_TABLET | Freq: Every day | ORAL | 2 refills | Status: DC

## 2017-11-28 NOTE — Patient Instructions (Addendum)
Great to see you. We are stopping the by mouth estrogen and starting vaginal tablet.   I will call you with your lab results from today and you can view them online.    Trazodone and Belsomra- look up both of them for me.

## 2017-11-28 NOTE — Assessment & Plan Note (Signed)
Reviewed preventive care protocols, scheduled due services, and updated immunizations Discussed nutrition, exercise, diet, and healthy lifestyle.  

## 2017-11-28 NOTE — Assessment & Plan Note (Signed)
  The problem of recurrent insomnia is discussed. Avoidance of caffeine sources is strongly encouraged. Sleep hygiene issues are reviewed. Discussed starting trazodone or belsomra.  She would like to think about it and research both before starting an rx for insomnia.

## 2017-11-28 NOTE — Assessment & Plan Note (Signed)
Labs today

## 2017-11-28 NOTE — Assessment & Plan Note (Signed)
Discussed tx options.  D/c or estrace as she feels it is not helping much with vaginal dryness.  Will try treatment for more superficial vaginal atrophy symptoms  generic of Vagifem.  Will insert 1 tablet every nightly. Call or return to clinic prn if these symptoms worsen or fail to improve as anticipated. The patient indicates understanding of these issues and agrees with the plan.

## 2017-11-28 NOTE — Assessment & Plan Note (Signed)
Improved with spironolactone. She asks me to refill this today. Since BP is normal and we are checking a CMET today, agreed that refills were appropriate- eRx refills sent.

## 2017-11-28 NOTE — Progress Notes (Signed)
Subjective:   Patient ID: Patricia Dunlap, female    DOB: Jan 07, 1966, 52 y.o.   MRN: [id number]  CERI MAYER is a pleasant 52 y.o. year old female who presents to clinic today with Annual Exam (CPE, PAP and mammogram will be completed on 6.28... fasting today.)  on 11/28/2017  HPI:   Has OBGYN, Dr. Garwin Brothers- mammogram already scheduled for 11/2017.  S/p hysterectomy for fibroid tumors in 2005.  Colonoscopy 11/04/16.  Health Maintenance  Topic Date Due  . HIV Screening  02/10/1981  . MAMMOGRAM  02/11/1984  . TETANUS/TDAP  06/05/2017  . INFLUENZA VACCINE  01/26/2018  . COLONOSCOPY  11/04/2021     She has been taking estrace insomnia and vaginal dryness.  Feels it has not helped much for either issue.  Dermatologist started her on spironolactone for acne.  She does feel it is helping.  Lab Results  Component Value Date   CHOL 223 (H) 09/03/2016   HDL 70.90 09/03/2016   LDLCALC 146 (H) 09/03/2016   LDLDIRECT 115.7 09/16/2010   TRIG 35.0 09/03/2016   CHOLHDL 3 09/03/2016   Lab Results  Component Value Date   ALT 27 09/03/2016   AST 24 09/03/2016   ALKPHOS 53 09/03/2016   BILITOT 0.3 09/03/2016   Lab Results  Component Value Date   NA 141 09/03/2016   K 4.2 09/03/2016   CL 107 09/03/2016   CO2 28 09/03/2016   Lab Results  Component Value Date   WBC 5.3 09/03/2016   HGB 12.7 09/03/2016   HCT 38.0 09/03/2016   MCV 89.2 09/03/2016   PLT 297.0 09/03/2016   Lab Results  Component Value Date   TSH 1.17 09/03/2016   Current Outpatient Medications on File Prior to Visit  Medication Sig Dispense Refill  . Multiple Vitamin (MULTIVITAMIN) capsule Take 1 capsule by mouth daily.      Marland Kitchen loratadine (CLARITIN) 10 MG tablet Take 10 mg by mouth daily as needed for allergies.     Current Facility-Administered Medications on File Prior to Visit  Medication Dose Route Frequency Provider Last Rate Last Dose  . 0.9 %  sodium chloride infusion  500 mL Intravenous  Continuous Pyrtle, Lajuan Lines, MD        No Known Allergies  Past Medical History:  Diagnosis Date  . Allergy   . Anemia    resolved after hysterectomy  . Heart murmur     Past Surgical History:  Procedure Laterality Date  . ABDOMINAL HYSTERECTOMY    . WISDOM TOOTH EXTRACTION     age 17    Family History  Problem Relation Age of Onset  . Hyperlipidemia Mother   . Hypertension Father   . Colon cancer Neg Hx     Social History   Socioeconomic History  . Marital status: Married    Spouse name: Not on file  . Number of children: Not on file  . Years of education: Not on file  . Highest education level: Not on file  Occupational History  . Not on file  Social Needs  . Financial resource strain: Not on file  . Food insecurity:    Worry: Not on file    Inability: Not on file  . Transportation needs:    Medical: Not on file    Non-medical: Not on file  Tobacco Use  . Smoking status: Never Smoker  . Smokeless tobacco: Never Used  Substance and Sexual Activity  . Alcohol use: Yes    Alcohol/week:  0.6 oz    Types: 1 Glasses of wine per week  . Drug use: No  . Sexual activity: Yes  Lifestyle  . Physical activity:    Days per week: Not on file    Minutes per session: Not on file  . Stress: Not on file  Relationships  . Social connections:    Talks on phone: Not on file    Gets together: Not on file    Attends religious service: Not on file    Active member of club or organization: Not on file    Attends meetings of clubs or organizations: Not on file    Relationship status: Not on file  . Intimate partner violence:    Fear of current or ex partner: Not on file    Emotionally abused: Not on file    Physically abused: Not on file    Forced sexual activity: Not on file  Other Topics Concern  . Not on file  Social History Narrative  . Not on file   The PMH, PSH, Social History, Family History, Medications, and allergies have been reviewed in [facility], and have been  updated if relevant.   Review of Systems  Constitutional: Negative.   HENT: Negative.   Eyes: Negative.   Respiratory: Negative.   Cardiovascular: Negative.   Gastrointestinal: Negative.   Endocrine: Negative.   Genitourinary: Positive for dyspareunia. Negative for decreased urine volume, vaginal bleeding, vaginal discharge and vaginal pain.       +vaginal dryness  Allergic/Immunologic: Negative.   Neurological: Negative.   Psychiatric/Behavioral: Positive for sleep disturbance.  All other systems reviewed and are negative.      Objective:    BP 112/74 (BP Location: Left Arm, Patient Position: Sitting, Cuff Size: Normal)   Pulse 71   Temp 98.4 F (36.9 C) (Oral)   Ht 5' (1.524 m)   Wt 126 lb (57.2 kg)   SpO2 100%   BMI 24.61 kg/m    Physical Exam   General:  Well-developed,well-nourished,in no acute distress; alert,appropriate and cooperative throughout examination Head:  normocephalic and atraumatic.   Eyes:  vision grossly intact, PERRL Ears:  R ear normal and L ear normal externally, TMs clear bilaterally Nose:  no external deformity.   Mouth:  good dentition.   Neck:  No deformities, masses, or tenderness noted. Lungs:  Normal respiratory effort, chest expands symmetrically. Lungs are clear to auscultation, no crackles or wheezes. Heart:  Normal rate and regular rhythm. S1 and S2 normal without gallop, murmur, click, rub or other extra sounds. Abdomen:  Bowel sounds positive,abdomen soft and non-tender without masses, organomegaly or hernias noted. Msk:  No deformity or scoliosis noted of thoracic or lumbar spine.   Extremities:  No clubbing, cyanosis, edema, or deformity noted with normal full range of motion of all joints.   Neurologic:  alert & oriented X3 and gait normal.   Skin:  Intact without suspicious lesions or rashes Cervical Nodes:  No lymphadenopathy noted Axillary Nodes:  No palpable lymphadenopathy Psych:  Cognition and judgment appear intact.  Alert and cooperative with normal attention span and concentration. No apparent delusions, illusions, hallucinations       Assessment & Plan:   Need for Tdap vaccination - Plan: Tdap vaccine greater than or equal to 7yo IM  Well woman exam without gynecological exam - Plan: CBC with Differential/Platelet, TSH  Hyperlipidemia, unspecified hyperlipidemia type - Plan: Comprehensive metabolic panel, Lipid panel  Vaginal atrophy  Insomnia, unspecified type  Other acne No  follow-ups on file.

## 2017-11-29 ENCOUNTER — Telehealth: Payer: Self-pay | Admitting: Family Medicine

## 2017-11-29 DIAGNOSIS — E782 Mixed hyperlipidemia: Secondary | ICD-10-CM

## 2017-11-29 NOTE — Telephone Encounter (Signed)
Copied from [place] [id number]. Topic: Quick Communication - Lab Results >> Nov 29, 2017  9:01 AM Shawnie Pons, LPN wrote: Called patient to inform them of 11/28/17 lab results. When patient returns call, triage nurse may disclose results. [phone]

## 2017-11-30 MED ORDER — ATORVASTATIN CALCIUM 10 MG PO TABS: 10.0000 mg | ORAL_TABLET | Freq: Every day | ORAL | 3 refills | Status: DC

## 2017-11-30 NOTE — Telephone Encounter (Signed)
Patient would like a call back today please.

## 2017-11-30 NOTE — Telephone Encounter (Signed)
Dr Deborra Medina- she is willing to start on a statin

## 2017-11-30 NOTE — Telephone Encounter (Signed)
eRx for lipitor sent to pharmacy requested.  Please schedule fasting lipid panel and CMET in 8 weeks.

## 2017-11-30 NOTE — Telephone Encounter (Signed)
Pt given results per notes of Dr. Deborra Medina on 11/28/17. Pt verbalized understanding.Unable to document in result note due to result note not being routed to [facility]. Pt is willing to start on statin and would like for the medication to be sent to [facility] on [address]

## 2017-12-01 NOTE — Telephone Encounter (Signed)
Hey. Could you help me with this? Thank you-TLG

## 2017-12-01 NOTE — Telephone Encounter (Signed)
Let detail message inform the pt lab order 8 wks from today and rx sent to [place].

## 2017-12-23 DIAGNOSIS — Z1231 Encounter for screening mammogram for malignant neoplasm of breast: Secondary | ICD-10-CM | POA: Diagnosis not present

## 2017-12-23 DIAGNOSIS — Z6824 Body mass index (BMI) 24.0-24.9, adult: Secondary | ICD-10-CM | POA: Diagnosis not present

## 2017-12-23 DIAGNOSIS — Z01419 Encounter for gynecological examination (general) (routine) without abnormal findings: Secondary | ICD-10-CM | POA: Diagnosis not present

## 2018-02-28 DIAGNOSIS — H40033 Anatomical narrow angle, bilateral: Secondary | ICD-10-CM | POA: Diagnosis not present

## 2018-02-28 DIAGNOSIS — H04123 Dry eye syndrome of bilateral lacrimal glands: Secondary | ICD-10-CM | POA: Diagnosis not present

## 2018-08-04 DIAGNOSIS — B029 Zoster without complications: Secondary | ICD-10-CM | POA: Diagnosis not present

## 2018-09-07 DIAGNOSIS — E663 Overweight: Secondary | ICD-10-CM | POA: Diagnosis not present

## 2018-09-07 DIAGNOSIS — Z6824 Body mass index (BMI) 24.0-24.9, adult: Secondary | ICD-10-CM | POA: Diagnosis not present

## 2018-10-01 ENCOUNTER — Other Ambulatory Visit: Payer: Self-pay | Admitting: Family Medicine

## 2018-10-16 DIAGNOSIS — Z713 Dietary counseling and surveillance: Secondary | ICD-10-CM | POA: Diagnosis not present

## 2018-10-16 DIAGNOSIS — Z6824 Body mass index (BMI) 24.0-24.9, adult: Secondary | ICD-10-CM | POA: Diagnosis not present

## 2018-12-26 NOTE — Progress Notes (Signed)
Subjective:   Patient ID: Patricia Dunlap, female    DOB: [birth year], 53 y.o.   MRN: [id number]  Patricia Dunlap is a pleasant 53 y.o. year old female who presents to clinic today with Annual Exam (Pt prescreened at vehicle. She is here today for a CPE without PAP as she sees Dr. Garwin Brothers.)  on 12/27/2018  HPI:  Health Maintenance  Topic Date Due  . HIV Screening  02/10/1981  . MAMMOGRAM  02/11/1984  . INFLUENZA VACCINE  01/27/2019  . COLONOSCOPY  11/04/2021  . TETANUS/TDAP  11/29/2027   Has lost 8 pounds- intentionally.  She is walking 3 miles on work days and 4-5 miles on weekends.  Has cut back on sugar as well and drinking water.  Has OBGYN, Dr. Garwin Brothers-  S/p hysterectomy for fibroid tumors in 2005.  Colonoscopy 11/04/16.  Health Maintenance  Topic Date Due  . HIV Screening  02/10/1981  . MAMMOGRAM  02/11/1984  . INFLUENZA VACCINE  01/27/2019  . COLONOSCOPY  11/04/2021  . TETANUS/TDAP  11/29/2027    Dermatologist started her on spironolactone for acne.  She does feel it is helping.  HLD- taking lipitor 10 mg daily. Lab Results  Component Value Date   CHOL 244 (H) 11/28/2017   HDL 75.20 11/28/2017   LDLCALC 159 (H) 11/28/2017   LDLDIRECT 115.7 09/16/2010   TRIG 52.0 11/28/2017   CHOLHDL 3 11/28/2017   Lab Results  Component Value Date   ALT 20 11/28/2017   AST 18 11/28/2017   ALKPHOS 48 11/28/2017   BILITOT 0.4 11/28/2017   Lab Results  Component Value Date   NA 141 11/28/2017   K 4.0 11/28/2017   CL 105 11/28/2017   CO2 30 11/28/2017   Lab Results  Component Value Date   WBC 4.6 11/28/2017   HGB 12.8 11/28/2017   HCT 37.5 11/28/2017   MCV 90.9 11/28/2017   PLT 233.0 11/28/2017   Lab Results  Component Value Date   TSH 0.47 11/28/2017   No current outpatient medications on file prior to visit.   Current Facility-Administered Medications on File Prior to Visit  Medication Dose Route Frequency Provider Last Rate Last Dose  . 0.9 %  sodium  chloride infusion  500 mL Intravenous Continuous Pyrtle, Lajuan Lines, MD        No Known Allergies  Past Medical History:  Diagnosis Date  . Allergy   . Anemia    resolved after hysterectomy  . Heart murmur     Past Surgical History:  Procedure Laterality Date  . ABDOMINAL HYSTERECTOMY    . WISDOM TOOTH EXTRACTION     age 53    Family History  Problem Relation Age of Onset  . Hyperlipidemia Mother   . Hypertension Father   . Colon cancer Neg Hx     Social History   Socioeconomic History  . Marital status: Married    Spouse name: Not on file  . Number of children: Not on file  . Years of education: Not on file  . Highest education level: Not on file  Occupational History  . Not on file  Social Needs  . Financial resource strain: Not on file  . Food insecurity    Worry: Not on file    Inability: Not on file  . Transportation needs    Medical: Not on file    Non-medical: Not on file  Tobacco Use  . Smoking status: Never Smoker  . Smokeless tobacco: Never Used  Substance and Sexual Activity  . Alcohol use: Yes    Alcohol/week: 1.0 standard drinks    Types: 1 Glasses of wine per week  . Drug use: No  . Sexual activity: Yes  Lifestyle  . Physical activity    Days per week: Not on file    Minutes per session: Not on file  . Stress: Not on file  Relationships  . Social Herbalist on phone: Not on file    Gets together: Not on file    Attends religious service: Not on file    Active member of club or organization: Not on file    Attends meetings of clubs or organizations: Not on file    Relationship status: Not on file  . Intimate partner violence    Fear of current or ex partner: Not on file    Emotionally abused: Not on file    Physically abused: Not on file    Forced sexual activity: Not on file  Other Topics Concern  . Not on file  Social History Narrative  . Not on file   The PMH, PSH, Social History, Family History, Medications, and  allergies have been reviewed in [facility], and have been updated if relevant.   Review of Systems  Constitutional: Negative.   HENT: Negative.   Eyes: Negative.   Respiratory: Negative.   Cardiovascular: Negative.   Gastrointestinal: Negative.   Endocrine: Negative.   Genitourinary: Negative for decreased urine volume, dyspareunia, vaginal bleeding, vaginal discharge and vaginal pain.  Allergic/Immunologic: Negative.   Neurological: Negative.   Psychiatric/Behavioral: Negative for sleep disturbance.  All other systems reviewed and are negative.      Objective:    BP 100/70   Pulse 85   Ht 5' (1.524 m)   Wt 118 lb 4 oz (53.6 kg)   SpO2 90%   BMI 23.09 kg/m   Wt Readings from Last 3 Encounters:  12/27/18 118 lb 4 oz (53.6 kg)  11/28/17 126 lb (57.2 kg)  11/04/16 124 lb (56.2 kg)     Physical Exam  Constitutional: She is oriented to person, place, and time. She appears well-developed and well-nourished. No distress.  HENT:  Head: Normocephalic and atraumatic.  Eyes: Conjunctivae are normal.  Neck: Normal range of motion.  Cardiovascular: Regular rhythm.  Pulmonary/Chest: Effort normal and breath sounds normal.  Abdominal: Soft. Bowel sounds are normal.  Musculoskeletal: Normal range of motion.        General: No edema.  Neurological: She is alert and oriented to person, place, and time. No cranial nerve deficit.  Skin: Skin is warm and dry. She is not diaphoretic.  Psychiatric: She has a normal mood and affect. Her behavior is normal. Judgment and thought content normal.  Nursing note reviewed.           Assessment & Plan:   Well woman exam without gynecological exam - Plan:   Hyperlipidemia, unspecified hyperlipidemia type - Plan:   Other acne - Plan:  No follow-ups on file.

## 2018-12-27 ENCOUNTER — Ambulatory Visit (INDEPENDENT_AMBULATORY_CARE_PROVIDER_SITE_OTHER): Payer: 59 | Admitting: Family Medicine

## 2018-12-27 ENCOUNTER — Encounter: Payer: Self-pay | Admitting: Family Medicine

## 2018-12-27 VITALS — BP 100/70 | HR 85 | Ht 60.0 in | Wt 118.2 lb

## 2018-12-27 DIAGNOSIS — E785 Hyperlipidemia, unspecified: Secondary | ICD-10-CM | POA: Diagnosis not present

## 2018-12-27 DIAGNOSIS — Z0000 Encounter for general adult medical examination without abnormal findings: Secondary | ICD-10-CM | POA: Diagnosis not present

## 2018-12-27 DIAGNOSIS — G4700 Insomnia, unspecified: Secondary | ICD-10-CM | POA: Diagnosis not present

## 2018-12-27 DIAGNOSIS — L708 Other acne: Secondary | ICD-10-CM | POA: Diagnosis not present

## 2018-12-27 LAB — LIPID PANEL
Cholesterol: 194 mg/dL (ref 0–200)
HDL: 83.7 mg/dL (ref >39.00)
LDL Cholesterol: 103 mg/dL — ABNORMAL HIGH (ref 0–99)
NonHDL: 110.47
Total CHOL/HDL Ratio: 2
Triglycerides: 35 mg/dL (ref 0.0–149.0)
VLDL: 7 mg/dL (ref 0.0–40.0)

## 2018-12-27 LAB — CBC WITH DIFFERENTIAL/PLATELET
Basophils Absolute: 0 10*3/uL (ref 0.0–0.1)
Basophils Relative: 0.8 % (ref 0.0–3.0)
Eosinophils Absolute: 0.1 10*3/uL (ref 0.0–0.7)
Eosinophils Relative: 1.4 % (ref 0.0–5.0)
HCT: 37.1 % (ref 36.0–46.0)
Hemoglobin: 12.7 g/dL (ref 12.0–15.0)
Lymphocytes Relative: 32.1 % (ref 12.0–46.0)
Lymphs Abs: 1.4 10*3/uL (ref 0.7–4.0)
MCHC: 34.2 g/dL (ref 30.0–36.0)
MCV: 91.3 fl (ref 78.0–100.0)
Monocytes Absolute: 0.4 10*3/uL (ref 0.1–1.0)
Monocytes Relative: 9.2 % (ref 3.0–12.0)
Neutro Abs: 2.4 10*3/uL (ref 1.4–7.7)
Neutrophils Relative %: 56.5 % (ref 43.0–77.0)
Platelets: 273 10*3/uL (ref 150.0–400.0)
RBC: 4.06 Mil/uL (ref 3.87–5.11)
RDW: 13.3 % (ref 11.5–15.5)
WBC: 4.2 10*3/uL (ref 4.0–10.5)

## 2018-12-27 LAB — COMPREHENSIVE METABOLIC PANEL
ALT: 21 U/L (ref 0–35)
AST: 21 U/L (ref 0–37)
Albumin: 4.6 g/dL (ref 3.5–5.2)
Alkaline Phosphatase: 56 U/L (ref 39–117)
BUN: 16 mg/dL (ref 6–23)
CO2: 29 mEq/L (ref 19–32)
Calcium: 10.3 mg/dL (ref 8.4–10.5)
Chloride: 105 mEq/L (ref 96–112)
Creatinine, Ser: 0.75 mg/dL (ref 0.40–1.20)
GFR: 97.86 mL/min (ref >60.00)
Glucose, Bld: 89 mg/dL (ref 70–99)
Potassium: 5 mEq/L (ref 3.5–5.1)
Sodium: 141 mEq/L (ref 135–145)
Total Bilirubin: 0.5 mg/dL (ref 0.2–1.2)
Total Protein: 7.4 g/dL (ref 6.0–8.3)

## 2018-12-27 LAB — TSH: TSH: 0.81 u[IU]/mL (ref 0.35–4.50)

## 2018-12-27 MED ORDER — ATORVASTATIN CALCIUM 10 MG PO TABS: 10.0000 mg | ORAL_TABLET | Freq: Every day | ORAL | 3 refills | Status: DC

## 2018-12-27 MED ORDER — SPIRONOLACTONE 50 MG PO TABS
50.0000 mg | ORAL_TABLET | Freq: Every day | ORAL | 0 refills | Status: DC
Start: 2018-12-27 — End: 2019-04-24

## 2018-12-27 NOTE — Patient Instructions (Addendum)
Great to see you. I will call you with your lab results from today and you can view them online.   Happy birthday!!!  Give my love to Ed!

## 2018-12-27 NOTE — Assessment & Plan Note (Signed)
Reviewed preventive care protocols, scheduled due services, and updated immunizations Discussed nutrition, exercise, diet, and healthy lifestyle.  Orders Placed This Encounter  Procedures  . CBC with Differential/Platelet  . Comprehensive metabolic panel  . Lipid panel  . TSH

## 2018-12-27 NOTE — Assessment & Plan Note (Signed)
On statin- check labs today.  She is hoping with her lifestyle changes and weight loss, her numbers will have improved.

## 2018-12-27 NOTE — Assessment & Plan Note (Signed)
On spironolactone.  Check labs today. eRX refilled.

## 2019-01-11 ENCOUNTER — Other Ambulatory Visit: Payer: Self-pay | Admitting: Internal Medicine

## 2019-01-11 DIAGNOSIS — Z20822 Contact with and (suspected) exposure to covid-19: Secondary | ICD-10-CM

## 2019-01-11 LAB — HM MAMMOGRAPHY

## 2019-01-16 LAB — NOVEL CORONAVIRUS, NAA: SARS-CoV-2, NAA: NOT DETECTED

## 2019-01-17 ENCOUNTER — Encounter: Payer: Self-pay | Admitting: Family Medicine

## 2019-01-17 NOTE — Progress Notes (Signed)
[facility]/thx dmf 

## 2019-01-25 ENCOUNTER — Ambulatory Visit: Payer: 59 | Admitting: Podiatry

## 2019-01-25 ENCOUNTER — Ambulatory Visit (INDEPENDENT_AMBULATORY_CARE_PROVIDER_SITE_OTHER): Payer: 59

## 2019-01-25 ENCOUNTER — Encounter: Payer: Self-pay | Admitting: Podiatry

## 2019-01-25 ENCOUNTER — Other Ambulatory Visit: Payer: Self-pay

## 2019-01-25 VITALS — BP 109/71 | HR 83 | Temp 97.1°F

## 2019-01-25 DIAGNOSIS — M722 Plantar fascial fibromatosis: Secondary | ICD-10-CM

## 2019-01-25 NOTE — Progress Notes (Signed)
Subjective:   Patient ID: Patricia Dunlap, female   DOB: 53 y.o.   MRN: [id number]   HPI Patient states that she has a lot of pain in the heels of both feet and it is been very hard for her to walk or be comfortable.  States is been getting gradually worse over the last few months and she had a history of this but did very well for a number of years   Review of Systems  All other systems reviewed and are negative.       Objective:  Physical Exam Vitals signs and nursing note reviewed.  Constitutional:      Appearance: She is well-developed.  Pulmonary:     Effort: Pulmonary effort is normal.  Musculoskeletal: Normal range of motion.  Skin:    General: Skin is warm.  Neurological:     Mental Status: She is alert.     Neurovascular status intact muscle strength is adequate range of motion within normal limits.  Patient is found to have exquisite discomfort in the plantar heel region left over right with inflammation fluid around the medial band and is noted to have good digital perfusion and well oriented x3     Assessment:  Acute plantar fasciitis left over right with inflammation fluid buildup     Plan:  H&P x-rays reviewed and today I did sterile prep and injected the plantar fascial bilateral 3 mg Kenalog 5 mg Xylocaine and applied fascial brace to lift up the arches with instructions on physical therapy shoe gear modifications.  Reappoint to recheck 2 weeks and placed on diclofenac 75 mg twice daily  X-ray indicated small spur no indication stress fracture arthritis condition

## 2019-04-16 ENCOUNTER — Telehealth: Payer: Self-pay | Admitting: Family Medicine

## 2019-04-16 NOTE — Telephone Encounter (Signed)
Patient called and wanted to schedule an appointment. Due to patient work schedule our appointment times did not meet patient needs. Patient was stung by bees on Saturday more than once. Patient is asking to what can she take for the bee stings. Patient stated that she is not having a fever or reaction to the bee stings. Please contact patient.

## 2019-04-17 NOTE — Telephone Encounter (Addendum)
After talking with Dr. Aron/Talked to pt/she is taking allergy medication/advised to put clean copper pennies on the areas/denies any difficulty breathing etc/thx dmf

## 2019-04-23 ENCOUNTER — Other Ambulatory Visit: Payer: Self-pay | Admitting: Family Medicine

## 2019-05-07 ENCOUNTER — Other Ambulatory Visit: Payer: Self-pay

## 2019-05-07 DIAGNOSIS — Z20822 Contact with and (suspected) exposure to covid-19: Secondary | ICD-10-CM

## 2019-05-08 LAB — NOVEL CORONAVIRUS, NAA: SARS-CoV-2, NAA: NOT DETECTED

## 2019-07-30 ENCOUNTER — Other Ambulatory Visit: Payer: 59

## 2019-07-31 ENCOUNTER — Ambulatory Visit: Payer: 59 | Attending: Internal Medicine

## 2019-07-31 DIAGNOSIS — Z20822 Contact with and (suspected) exposure to covid-19: Secondary | ICD-10-CM

## 2019-08-01 LAB — NOVEL CORONAVIRUS, NAA: SARS-CoV-2, NAA: NOT DETECTED

## 2020-01-30 ENCOUNTER — Encounter: Payer: 59 | Admitting: Nurse Practitioner

## 2020-02-28 ENCOUNTER — Other Ambulatory Visit: Payer: Self-pay

## 2020-02-29 ENCOUNTER — Encounter: Payer: Self-pay | Admitting: Family Medicine

## 2020-02-29 ENCOUNTER — Ambulatory Visit (INDEPENDENT_AMBULATORY_CARE_PROVIDER_SITE_OTHER): Payer: 59 | Admitting: Family Medicine

## 2020-02-29 VITALS — BP 120/80 | HR 60 | Temp 97.7°F | Ht 60.0 in | Wt 121.0 lb

## 2020-02-29 DIAGNOSIS — Z1321 Encounter for screening for nutritional disorder: Secondary | ICD-10-CM | POA: Diagnosis not present

## 2020-02-29 DIAGNOSIS — E785 Hyperlipidemia, unspecified: Secondary | ICD-10-CM | POA: Diagnosis not present

## 2020-02-29 DIAGNOSIS — L708 Other acne: Secondary | ICD-10-CM

## 2020-02-29 DIAGNOSIS — Z0000 Encounter for general adult medical examination without abnormal findings: Secondary | ICD-10-CM | POA: Diagnosis not present

## 2020-02-29 LAB — BASIC METABOLIC PANEL
BUN: 13 mg/dL (ref 6–23)
CO2: 28 mEq/L (ref 19–32)
Calcium: 9.7 mg/dL (ref 8.4–10.5)
Chloride: 103 mEq/L (ref 96–112)
Creatinine, Ser: 0.78 mg/dL (ref 0.40–1.20)
GFR: 93.11 mL/min (ref >60.00)
Glucose, Bld: 82 mg/dL (ref 70–99)
Potassium: 4.2 mEq/L (ref 3.5–5.1)
Sodium: 139 mEq/L (ref 135–145)

## 2020-02-29 LAB — VITAMIN D 25 HYDROXY (VIT D DEFICIENCY, FRACTURES): VITD: 27.5 ng/mL — ABNORMAL LOW (ref 30.00–100.00)

## 2020-02-29 LAB — AST: AST: 22 U/L (ref 0–37)

## 2020-02-29 LAB — LIPID PANEL
Cholesterol: 204 mg/dL — ABNORMAL HIGH (ref 0–200)
HDL: 90 mg/dL (ref >39.00)
LDL Cholesterol: 107 mg/dL — ABNORMAL HIGH (ref 0–99)
NonHDL: 113.71
Total CHOL/HDL Ratio: 2
Triglycerides: 36 mg/dL (ref 0.0–149.0)
VLDL: 7.2 mg/dL (ref 0.0–40.0)

## 2020-02-29 LAB — ALT: ALT: 20 U/L (ref 0–35)

## 2020-02-29 MED ORDER — SPIRONOLACTONE 50 MG PO TABS: 50.0000 mg | ORAL_TABLET | Freq: Every day | ORAL | 3 refills | Status: DC

## 2020-02-29 MED ORDER — ATORVASTATIN CALCIUM 10 MG PO TABS: 10.0000 mg | ORAL_TABLET | Freq: Every day | ORAL | 3 refills | Status: DC

## 2020-02-29 NOTE — Progress Notes (Signed)
Patricia Dunlap is a 54 y.o. female  Chief Complaint  Patient presents with  . Establish Care    TOC from [facility]-CPE-Pt is fasting//    HPI: Patricia Dunlap is a 54 y.o. female seen today for [facility] appt, previous PCP Dr. Deborra Medina, and annual CPE and fasting labs.   Last PAP: s/p TAH Last mammo: 12/2018 - she has appt scheduled Last Dexa: 2014 - normal Last colonoscopy: 10/2016 - due in 10/2021 - Dr. Hilarie Fredrickson with [facility]  Diet/Exercise: well-balanced diet, exercises 3-4x/wk Dental: UTD Vision: due - pt wears glasses  Med refills needed today? yes see orders   Past Medical History:  Diagnosis Date  . Allergy   . Anemia    resolved after hysterectomy  . Heart murmur     Past Surgical History:  Procedure Laterality Date  . ABDOMINAL HYSTERECTOMY    . WISDOM TOOTH EXTRACTION     age 31    Social History   Socioeconomic History  . Marital status: Married    Spouse name: Not on file  . Number of children: Not on file  . Years of education: Not on file  . Highest education level: Not on file  Occupational History  . Not on file  Tobacco Use  . Smoking status: Never Smoker  . Smokeless tobacco: Never Used  Vaping Use  . Vaping Use: Never used  Substance and Sexual Activity  . Alcohol use: Yes    Alcohol/week: 1.0 standard drink    Types: 1 Glasses of wine per week  . Drug use: No  . Sexual activity: Yes  Other Topics Concern  . Not on file  Social History Narrative  . Not on file   Social Determinants of Health   Financial Resource Strain:   . Difficulty of Paying Living Expenses: Not on file  Food Insecurity:   . Worried About Charity fundraiser in the Last Year: Not on file  . Ran Out of Food in the Last Year: Not on file  Transportation Needs:   . Lack of Transportation (Medical): Not on file  . Lack of Transportation (Non-Medical): Not on file  Physical Activity:   . Days of Exercise per Week: Not on file  . Minutes of Exercise per Session: Not on file   Stress:   . Feeling of Stress : Not on file  Social Connections:   . Frequency of Communication with Friends and Family: Not on file  . Frequency of Social Gatherings with Friends and Family: Not on file  . Attends Religious Services: Not on file  . Active Member of Clubs or Organizations: Not on file  . Attends Archivist Meetings: Not on file  . Marital Status: Not on file  Intimate Partner Violence:   . Fear of Current or Ex-Partner: Not on file  . Emotionally Abused: Not on file  . Physically Abused: Not on file  . Sexually Abused: Not on file    Family History  Problem Relation Age of Onset  . Hyperlipidemia Mother   . Hypertension Father   . Colon cancer Neg Hx      Immunization History  Administered Date(s) Administered  . Influenza, Seasonal, Injecte, Preservative Fre 03/28/2016  . Influenza,inj,Quad PF,6+ Mos 05/19/2019  . Tdap 11/28/2017    Outpatient Encounter Medications as of 02/29/2020  Medication Sig  . atorvastatin (LIPITOR) 10 MG tablet Take 1 tablet (10 mg total) by mouth daily.  . OSPHENA 60 MG TABS Take 1  tablet by mouth daily.  Marland Kitchen PREMARIN 0.625 MG tablet Take 0.625 mg by mouth daily.  . RESTASIS 0.05 % ophthalmic emulsion   . spironolactone (ALDACTONE) 50 MG tablet Take 1 tablet by mouth once daily   Facility-Administered Encounter Medications as of 02/29/2020  Medication  . 0.9 %  sodium chloride infusion     ROS: Gen: no fever, chills  Skin: no rash, itching ENT: no ear pain, ear drainage, nasal congestion, rhinorrhea, sinus pressure, sore throat Eyes: no blurry vision, double vision Resp: no cough, wheeze,SOB Breast: no breast tenderness, no nipple discharge, no breast masses CV: no CP, palpitations, LE edema,  GI: no heartburn, n/v/d/c, abd pain GU: no dysuria, urgency, frequency, hematuria MSK: no joint pain, myalgias, back pain Neuro: no dizziness, headache, weakness, vertigo Psych: no depression, anxiety, insomnia   No  Known Allergies  BP 120/80   Pulse 60   Temp 97.7 F (36.5 C) (Temporal)   Ht 5' (1.524 m)   Wt 121 lb (54.9 kg)   SpO2 96%   BMI 23.63 kg/m   Physical Exam Constitutional:      General: She is not in acute distress.    Appearance: She is well-developed.  HENT:     Head: Normocephalic and atraumatic.     Right Ear: Tympanic membrane and ear canal normal.     Left Ear: Tympanic membrane and ear canal normal.     Nose: Nose normal.  Eyes:     Conjunctiva/sclera: Conjunctivae normal.     Pupils: Pupils are equal, round, and reactive to light.  Neck:     Thyroid: No thyromegaly.  Cardiovascular:     Rate and Rhythm: Normal rate and regular rhythm.     Heart sounds: Normal heart sounds. No murmur heard.   Pulmonary:     Effort: Pulmonary effort is normal. No respiratory distress.     Breath sounds: Normal breath sounds. No wheezing or rhonchi.  Abdominal:     General: Bowel sounds are normal. There is no distension.     Palpations: Abdomen is soft. There is no mass.     Tenderness: There is no abdominal tenderness.  Musculoskeletal:     Cervical back: Neck supple.  Lymphadenopathy:     Cervical: No cervical adenopathy.  Skin:    General: Skin is warm and dry.  Neurological:     Mental Status: She is alert and oriented to person, place, and time.     Motor: No abnormal muscle tone.     Coordination: Coordination normal.  Psychiatric:        Behavior: Behavior normal.      A/P:  1. Annual physical exam - discussed importance of regular CV exercise, healthy diet, adequate sleep - colonoscopy UTD, mammo scheduled - UTD on dental, pt to schedule vision exam - ALT - AST - Basic metabolic panel - Lipid panel - VITAMIN D 25 Hydroxy (Vit-D Deficiency, Fractures) - next CPE in 1 year  2. Hyperlipidemia, unspecified hyperlipidemia type - regular CV exercise and healthy diet - cont - Lipid panel Refill: - atorvastatin (LIPITOR) 10 MG tablet; Take 1 tablet (10 mg  total) by mouth daily.  Dispense: 90 tablet; Refill: 3  3. Encounter for vitamin deficiency screening - VITAMIN D 25 Hydroxy (Vit-D Deficiency, Fractures)  4. Other acne - well-controlled Refill: - spironolactone (ALDACTONE) 50 MG tablet; Take 1 tablet (50 mg total) by mouth daily.  Dispense: 90 tablet; Refill: 3   This visit occurred during the SARS-CoV-2  public health emergency.  Safety protocols were in place, including screening questions prior to the visit, additional usage of staff PPE, and extensive cleaning of exam room while observing appropriate contact time as indicated for disinfecting solutions.

## 2020-07-08 ENCOUNTER — Other Ambulatory Visit: Payer: Self-pay

## 2020-07-09 ENCOUNTER — Encounter: Payer: Self-pay | Admitting: Family Medicine

## 2020-07-09 ENCOUNTER — Ambulatory Visit: Payer: 59 | Admitting: Family Medicine

## 2020-07-09 VITALS — BP 118/76 | HR 72 | Temp 97.5°F | Ht 60.0 in | Wt 126.0 lb

## 2020-07-09 DIAGNOSIS — L309 Dermatitis, unspecified: Secondary | ICD-10-CM | POA: Diagnosis not present

## 2020-07-09 DIAGNOSIS — G8929 Other chronic pain: Secondary | ICD-10-CM | POA: Diagnosis not present

## 2020-07-09 DIAGNOSIS — M545 Low back pain, unspecified: Secondary | ICD-10-CM | POA: Diagnosis not present

## 2020-07-09 DIAGNOSIS — Z23 Encounter for immunization: Secondary | ICD-10-CM

## 2020-07-09 MED ORDER — NYSTATIN-TRIAMCINOLONE 100000-0.1 UNIT/GM-% EX OINT: 1.0000 "application " | TOPICAL_OINTMENT | Freq: Two times a day (BID) | CUTANEOUS | 2 refills | Status: DC

## 2020-07-09 MED ORDER — NAPROXEN 500 MG PO TABS: 500.0000 mg | ORAL_TABLET | Freq: Two times a day (BID) | ORAL | 0 refills | Status: DC

## 2020-07-09 NOTE — Patient Instructions (Signed)
Heating pad and ice pack  2-3x/day 15-80min  After heating pad, exercises daily Naproxen 500mg  1 tab twice per day with food x 7 days Follow-up in 2-3 wks if no/minimal improvement   Low Back Sprain or Strain Rehab Ask your health care provider which exercises are safe for you. Do exercises exactly as told by your health care provider and adjust them as directed. It is normal to feel mild stretching, pulling, tightness, or discomfort as you do these exercises. Stop right away if you feel sudden pain or your pain gets worse. Do not begin these exercises until told by your health care provider. Stretching and range-of-motion exercises These exercises warm up your muscles and joints and improve the movement and flexibility of your back. These exercises also help to relieve pain, numbness, and tingling. Lumbar rotation 1. Lie on your back on a firm surface and bend your knees. 2. Straighten your arms out to your sides so each arm forms a 90-degree angle (right angle) with a side of your body. 3. Slowly move (rotate) both of your knees to one side of your body until you feel a stretch in your lower back (lumbar). Try not to let your shoulders lift off the floor. 4. Hold this position for __________ seconds. 5. Tense your abdominal muscles and slowly move your knees back to the starting position. 6. Repeat this exercise on the other side of your body. Repeat __________ times. Complete this exercise __________ times a day.   Single knee to chest 1. Lie on your back on a firm surface with both legs straight. 2. Bend one of your knees. Use your hands to move your knee up toward your chest until you feel a gentle stretch in your lower back and buttock. ? Hold your leg in this position by holding on to the front of your knee. ? Keep your other leg as straight as possible. 3. Hold this position for __________ seconds. 4. Slowly return to the starting position. 5. Repeat with your other leg. Repeat  __________ times. Complete this exercise __________ times a day.   Prone extension on elbows 1. Lie on your abdomen on a firm surface (prone position). 2. Prop yourself up on your elbows. 3. Use your arms to help lift your chest up until you feel a gentle stretch in your abdomen and your lower back. ? This will place some of your body weight on your elbows. If this is uncomfortable, try stacking pillows under your chest. ? Your hips should stay down, against the surface that you are lying on. Keep your hip and back muscles relaxed. 4. Hold this position for __________ seconds. 5. Slowly relax your upper body and return to the starting position. Repeat __________ times. Complete this exercise __________ times a day.   Strengthening exercises These exercises build strength and endurance in your back. Endurance is the ability to use your muscles for a long time, even after they get tired. Pelvic tilt This exercise strengthens the muscles that lie deep in the abdomen. 1. Lie on your back on a firm surface. Bend your knees and keep your feet flat on the floor. 2. Tense your abdominal muscles. Tip your pelvis up toward the ceiling and flatten your lower back into the floor. ? To help with this exercise, you may place a small towel under your lower back and try to push your back into the towel. 3. Hold this position for __________ seconds. 4. Let your muscles relax completely before you  repeat this exercise. Repeat __________ times. Complete this exercise __________ times a day. Alternating arm and leg raises 1. Get on your hands and knees on a firm surface. If you are on a hard floor, you may want to use padding, such as an exercise mat, to cushion your knees. 2. Line up your arms and legs. Your hands should be directly below your shoulders, and your knees should be directly below your hips. 3. Lift your left leg behind you. At the same time, raise your right arm and straighten it in front of  you. ? Do not lift your leg higher than your hip. ? Do not lift your arm higher than your shoulder. ? Keep your abdominal and back muscles tight. ? Keep your hips facing the ground. ? Do not arch your back. ? Keep your balance carefully, and do not hold your breath. 4. Hold this position for __________ seconds. 5. Slowly return to the starting position. 6. Repeat with your right leg and your left arm. Repeat __________ times. Complete this exercise __________ times a day.   Abdominal set with straight leg raise 1. Lie on your back on a firm surface. 2. Bend one of your knees and keep your other leg straight. 3. Tense your abdominal muscles and lift your straight leg up, 4-6 inches (10-15 cm) off the ground. 4. Keep your abdominal muscles tight and hold this position for __________ seconds. ? Do not hold your breath. ? Do not arch your back. Keep it flat against the ground. 5. Keep your abdominal muscles tense as you slowly lower your leg back to the starting position. 6. Repeat with your other leg. Repeat __________ times. Complete this exercise __________ times a day.   Single leg lower with bent knees 1. Lie on your back on a firm surface. 2. Tense your abdominal muscles and lift your feet off the floor, one foot at a time, so your knees and hips are bent in 90-degree angles (right angles). ? Your knees should be over your hips and your lower legs should be parallel to the floor. 3. Keeping your abdominal muscles tense and your knee bent, slowly lower one of your legs so your toe touches the ground. 4. Lift your leg back up to return to the starting position. ? Do not hold your breath. ? Do not let your back arch. Keep your back flat against the ground. 5. Repeat with your other leg. Repeat __________ times. Complete this exercise __________ times a day. Posture and body mechanics Good posture and healthy body mechanics can help to relieve stress in your body's tissues and joints.  Body mechanics refers to the movements and positions of your body while you do your daily activities. Posture is part of body mechanics. Good posture means:  Your spine is in its natural S-curve position (neutral).  Your shoulders are pulled back slightly.  Your head is not tipped forward. Follow these guidelines to improve your posture and body mechanics in your everyday activities. Standing  When standing, keep your spine neutral and your feet about hip width apart. Keep a slight bend in your knees. Your ears, shoulders, and hips should line up.  When you do a task in which you stand in one place for a long time, place one foot up on a stable object that is 2-4 inches (5-10 cm) high, such as a footstool. This helps keep your spine neutral.   Sitting  When sitting, keep your spine neutral and keep your  feet flat on the floor. Use a footrest, if necessary, and keep your thighs parallel to the floor. Avoid rounding your shoulders, and avoid tilting your head forward.  When working at a desk or a computer, keep your desk at a height where your hands are slightly lower than your elbows. Slide your chair under your desk so you are close enough to maintain good posture.  When working at a computer, place your monitor at a height where you are looking straight ahead and you do not have to tilt your head forward or downward to look at the screen.   Resting  When lying down and resting, avoid positions that are most painful for you.  If you have pain with activities such as sitting, bending, stooping, or squatting, lie in a position in which your body does not bend very much. For example, avoid curling up on your side with your arms and knees near your chest (fetal position).  If you have pain with activities such as standing for a long time or reaching with your arms, lie with your spine in a neutral position and bend your knees slightly. Try the following positions: ? Lying on your side with a  pillow between your knees. ? Lying on your back with a pillow under your knees. Lifting  When lifting objects, keep your feet at least shoulder width apart and tighten your abdominal muscles.  Bend your knees and hips and keep your spine neutral. It is important to lift using the strength of your legs, not your back. Do not lock your knees straight out.  Always ask for help to lift heavy or awkward objects.   This information is not intended to replace advice given to you by your health care provider. Make sure you discuss any questions you have with your health care provider. Document Revised: 10/06/2018 Document Reviewed: 07/06/2018 Elsevier Patient Education  [place].

## 2020-07-09 NOTE — Progress Notes (Signed)
Patricia Dunlap is a 55 y.o. female  Chief Complaint  Patient presents with  . Acute Visit    Low Back pain that is radiating into both legs x 3 months.  She states that her legs feel as if they are going to go out from under her.   Also wants some discoloration in her armpits. Wants flu shot today.      HPI: Patricia Dunlap is a 54 y.o. female who complains of 3 mo h/o low back pain with radiation to B/L legs. Her legs feel weak and like they would go out. No numbness or tingling. Symptoms are intermittent throughout the day. Worse first thing in the AM and later in the evening. Takes 2 tylenol in AM and this helps somewhat. No other meds that she has tried. She feels better if she is able to squat and stretch hips out. No injury or trauma. Pt states she has had lower back pain x few days at a time over the years but noting to this extent.   She notes B/L axillary discoloration and now itching and soreness in Lt axilla. She has tried vaseline and neosporin.   She would like flu vaccine today.   Past Medical History:  Diagnosis Date  . Allergy   . Anemia    resolved after hysterectomy  . Heart murmur     Past Surgical History:  Procedure Laterality Date  . ABDOMINAL HYSTERECTOMY    . WISDOM TOOTH EXTRACTION     age 58    Social History   Socioeconomic History  . Marital status: Married    Spouse name: Not on file  . Number of children: Not on file  . Years of education: Not on file  . Highest education level: Not on file  Occupational History  . Not on file  Tobacco Use  . Smoking status: Never Smoker  . Smokeless tobacco: Never Used  Vaping Use  . Vaping Use: Never used  Substance and Sexual Activity  . Alcohol use: Yes    Alcohol/week: 1.0 standard drink    Types: 1 Glasses of wine per week  . Drug use: No  . Sexual activity: Yes  Other Topics Concern  . Not on file  Social History Narrative  . Not on file   Social Determinants of Health   Financial  Resource Strain: Not on file  Food Insecurity: Not on file  Transportation Needs: Not on file  Physical Activity: Not on file  Stress: Not on file  Social Connections: Not on file  Intimate Partner Violence: Not on file    Family History  Problem Relation Age of Onset  . Hyperlipidemia Mother   . Hypertension Father   . Colon cancer Neg Hx      Immunization History  Administered Date(s) Administered  . Influenza, Seasonal, Injecte, Preservative Fre 03/28/2016  . Influenza,inj,Quad PF,6+ Mos 05/19/2019  . PFIZER SARS-COV-2 Vaccination 09/04/2019, 10/09/2019, 04/08/2020  . Tdap 11/28/2017    Outpatient Encounter Medications as of 07/09/2020  Medication Sig  . atorvastatin (LIPITOR) 10 MG tablet Take 1 tablet (10 mg total) by mouth daily.  . Cholecalciferol (VITAMIN D) 125 MCG (5000 UT) CAPS Take 2,000 mg by mouth.  . Multiple Vitamins-Minerals (MULTIVITAMIN WITH MINERALS) tablet Take 1 tablet by mouth daily.  . OSPHENA 60 MG TABS Take 1 tablet by mouth daily.  Marland Kitchen PREMARIN 0.625 MG tablet Take 0.625 mg by mouth daily.  . RESTASIS 0.05 % ophthalmic emulsion   .  spironolactone (ALDACTONE) 50 MG tablet Take 1 tablet (50 mg total) by mouth daily.   Facility-Administered Encounter Medications as of 07/09/2020  Medication  . 0.9 %  sodium chloride infusion     ROS: Pertinent positives and negatives noted in HPI. Remainder of ROS non-contributory  No Known Allergies  BP 118/76   Pulse 72   Temp (!) 97.5 F (36.4 C) (Temporal)   Ht 5' (1.524 m)   Wt 126 lb (57.2 kg)   SpO2 99%   BMI 24.61 kg/m   Physical Exam Constitutional:      General: She is not in acute distress.    Appearance: Normal appearance. She is normal weight.  Musculoskeletal:     Lumbar back: Tenderness present. No deformity, spasms or bony tenderness. Normal range of motion.     Right hip: Tenderness present. Normal range of motion.     Left hip: Tenderness present. Normal range of motion.   Neurological:     General: No focal deficit present.     Mental Status: She is alert and oriented to person, place, and time.     Motor: No weakness.     Coordination: Coordination normal.     Gait: Gait normal.     Deep Tendon Reflexes: Reflexes normal.  Psychiatric:        Mood and Affect: Mood normal.        Behavior: Behavior normal.      A/P:  1. Dermatitis - x 3 mo, axillary and Lt > Rt - stop using deodorant x 1 week and switch to [place] when restarting Rx:  - nystatin-triamcinolone ointment (MYCOLOG); Apply 1 application topically 2 (two) times daily.  Dispense: 30 g; Refill: 2 - pt to use BID x 1.5-2 wks then stop - f/u PRN  2. Chronic bilateral low back pain without sciatica - symptoms x 3-4 mo - some arthritis component and also likely some MSK component - heat 2-3x/day, daily  Rx: - naproxen (NAPROSYN) 500 MG tablet; Take 1 tablet (500 mg total) by mouth 2 (two) times daily with a meal.  Dispense: 60 tablet; Refill: 0 - BID w/ food x 7 days then stop - f/u if symptoms do not improve about 3 wks Discussed plan and reviewed medications with patient, including risks, benefits, and potential side effects. Pt expressed understand. All questions answered.    This visit occurred during the SARS-CoV-2 public health emergency.  Safety protocols were in place, including screening questions prior to the visit, additional usage of staff PPE, and extensive cleaning of exam room while observing appropriate contact time as indicated for disinfecting solutions.

## 2020-07-10 ENCOUNTER — Encounter: Payer: Self-pay | Admitting: Family Medicine

## 2020-07-11 MED ORDER — TRIAMCINOLONE ACETONIDE 0.1 % EX CREA: 1.0000 "application " | TOPICAL_CREAM | Freq: Two times a day (BID) | CUTANEOUS | 1 refills | Status: DC

## 2020-07-11 MED ORDER — NYSTATIN 100000 UNIT/GM EX CREA: 1.0000 "application " | TOPICAL_CREAM | Freq: Two times a day (BID) | CUTANEOUS | 1 refills | Status: DC

## 2021-02-22 ENCOUNTER — Ambulatory Visit
Admission: RE | Admit: 2021-02-22 | Discharge: 2021-02-22 | Disposition: A | Discharge status: Home / Self Care | Payer: 59 | Source: Ambulatory Visit | Attending: Internal Medicine | Admitting: Internal Medicine

## 2021-02-22 ENCOUNTER — Other Ambulatory Visit: Payer: Self-pay

## 2021-02-22 VITALS — BP 117/77 | HR 85 | Temp 98.1°F | Resp 18

## 2021-02-22 DIAGNOSIS — R053 Chronic cough: Secondary | ICD-10-CM | POA: Diagnosis not present

## 2021-02-22 DIAGNOSIS — J069 Acute upper respiratory infection, unspecified: Secondary | ICD-10-CM | POA: Diagnosis not present

## 2021-02-22 MED ORDER — AMOXICILLIN 875 MG PO TABS: 875.0000 mg | ORAL_TABLET | Freq: Two times a day (BID) | ORAL | 0 refills | Status: AC

## 2021-02-22 MED ORDER — PREDNISONE 20 MG PO TABS: 40.0000 mg | ORAL_TABLET | Freq: Every day | ORAL | 0 refills | Status: AC

## 2021-02-22 MED ORDER — GUAIFENESIN 200 MG PO TABS: 200.0000 mg | ORAL_TABLET | ORAL | 0 refills | Status: DC | PRN

## 2021-02-22 MED ORDER — CETIRIZINE HCL 10 MG PO TABS: 10.0000 mg | ORAL_TABLET | Freq: Every day | ORAL | 0 refills | Status: AC

## 2021-02-22 NOTE — ED Provider Notes (Signed)
[name]    CSN: [id number] Arrival date & time: 02/22/21  [id number]      History   Chief Complaint Chief Complaint  Patient presents with   appointment '@10p'$    Cough   Nasal Congestion    HPI Patricia Dunlap is a 55 y.o. female.   Patient presents with 2-week history of cough and nasal congestion.  Has been taking Alka-Seltzer cold and flu and antihistamines over-the-counter with no relief of symptoms.  Denies any chest pain or shortness of breath.  Denies any fevers or known sick contacts.  Denies any abdominal pain, nausea, diarrhea. No history of chronic lung disease.    Cough  Past Medical History:  Diagnosis Date   Allergy    Anemia    resolved after hysterectomy   Heart murmur     Patient Active Problem List   Diagnosis Date Noted   Well woman exam without gynecological exam 11/28/2017   HLD (hyperlipidemia) 11/28/2017   Vaginal atrophy 11/28/2017   Insomnia 11/28/2017   Acne 11/28/2017   Allergic rhinitis 09/16/2010   Heart murmur 09/16/2010    Past Surgical History:  Procedure Laterality Date   ABDOMINAL HYSTERECTOMY     WISDOM TOOTH EXTRACTION     age 76    OB History   No obstetric history on file.      Home Medications    Prior to Admission medications   Medication Sig Start Date End Date Taking? Authorizing Provider  amoxicillin (AMOXIL) 875 MG tablet Take 1 tablet (875 mg total) by mouth 2 (two) times daily for 10 days. 02/22/21 03/04/21 Yes Odis Luster, FNP  cetirizine (ZYRTEC) 10 MG tablet Take 1 tablet (10 mg total) by mouth daily. 02/22/21  Yes Odis Luster, FNP  guaiFENesin 200 MG tablet Take 1 tablet (200 mg total) by mouth every 4 (four) hours as needed for cough or to loosen phlegm. 02/22/21  Yes Odis Luster, FNP  predniSONE (DELTASONE) 20 MG tablet Take 2 tablets (40 mg total) by mouth daily for 5 days. 02/22/21 02/27/21 Yes Odis Luster, FNP  atorvastatin (LIPITOR) 10 MG tablet Take 1 tablet (10 mg total) by  mouth daily. 02/29/20   Cirigliano, Garvin Fila, DO  Cholecalciferol (VITAMIN D) 125 MCG (5000 UT) CAPS Take 2,000 mg by mouth.    [provider]  Multiple Vitamins-Minerals (MULTIVITAMIN WITH MINERALS) tablet Take 1 tablet by mouth daily.    [provider]  naproxen (NAPROSYN) 500 MG tablet Take 1 tablet (500 mg total) by mouth 2 (two) times daily with a meal. 07/09/20   Cirigliano, Garvin Fila, DO  nystatin cream (MYCOSTATIN) Apply 1 application topically 2 (two) times daily. 07/11/20   Cirigliano, Mary K, DO  OSPHENA 60 MG TABS Take 1 tablet by mouth daily. 01/26/20   [provider]  PREMARIN 0.625 MG tablet Take 0.625 mg by mouth daily. 01/15/20   [provider]  RESTASIS 0.05 % ophthalmic emulsion  01/26/20   [provider]  spironolactone (ALDACTONE) 50 MG tablet Take 1 tablet (50 mg total) by mouth daily. 02/29/20   Cirigliano, Garvin Fila, DO  triamcinolone (KENALOG) 0.1 % Apply 1 application topically 2 (two) times daily. 07/11/20   CiriglianoGarvin Fila, DO    Family History Family History  Problem Relation Age of Onset   Hyperlipidemia Mother    Hypertension Father    Colon cancer Neg Hx     Social History Social History   Tobacco Use  Smoking status: Never   Smokeless tobacco: Never  Vaping Use   Vaping Use: Never used  Substance Use Topics   Alcohol use: Yes    Alcohol/week: 1.0 standard drink    Types: 1 Glasses of wine per week   Drug use: No     Allergies   Patient has no known allergies.   Review of Systems Review of Systems  Respiratory:  Positive for cough.   Per HPI  Physical Exam Triage Vital Signs ED Triage Vitals  Enc Vitals Group     BP 02/22/21 1022 117/77     Pulse Rate 02/22/21 1022 85     Resp 02/22/21 1022 18     Temp 02/22/21 1022 98.1 F (36.7 C)     Temp Source 02/22/21 1022 Oral     SpO2 02/22/21 1022 98 %     Weight --      Height --      Head Circumference --      Peak Flow --      Pain Score  02/22/21 1025 0     Pain Loc --      Pain Edu? --      Excl. in [place]? --    No data found.  Updated Vital Signs BP 117/77 (BP Location: Right Arm)   Pulse 85   Temp 98.1 F (36.7 C) (Oral)   Resp 18   SpO2 98%   Visual Acuity Right Eye Distance:   Left Eye Distance:   Bilateral Distance:    Right Eye Near:   Left Eye Near:    Bilateral Near:     Physical Exam Constitutional:      General: She is not in acute distress.    Appearance: Normal appearance.  HENT:     Head: Normocephalic and atraumatic.     Right Ear: Ear canal normal. A middle ear effusion is present. Tympanic membrane is not erythematous or bulging.     Left Ear: Ear canal normal. A middle ear effusion is present. Tympanic membrane is not erythematous or bulging.     Nose: Congestion present.     Mouth/Throat:     Mouth: Mucous membranes are moist.     Pharynx: No posterior oropharyngeal erythema.  Eyes:     Extraocular Movements: Extraocular movements intact.     Conjunctiva/sclera: Conjunctivae normal.     Pupils: Pupils are equal, round, and reactive to light.  Cardiovascular:     Rate and Rhythm: Normal rate and regular rhythm.     Pulses: Normal pulses.     Heart sounds: Normal heart sounds.  Pulmonary:     Effort: Pulmonary effort is normal. No respiratory distress.     Breath sounds: Normal breath sounds. No stridor. No wheezing, rhonchi or rales.  Abdominal:     General: Abdomen is flat. Bowel sounds are normal.     Palpations: Abdomen is soft.  Musculoskeletal:        General: Normal range of motion.     Cervical back: Normal range of motion.  Skin:    General: Skin is warm and dry.  Neurological:     General: No focal deficit present.     Mental Status: She is alert and oriented to person, place, and time. Mental status is at baseline.  Psychiatric:        Mood and Affect: Mood normal.        Behavior: Behavior normal.     UC Treatments / Results  Labs (  all labs ordered are  listed, but only abnormal results are displayed) Labs Reviewed - No data to display  EKG   Radiology No results found.  Procedures Procedures (including critical care time)  Medications Ordered in UC Medications - No data to display  Initial Impression / Assessment and Plan / UC Course  I have reviewed the triage vital signs and the nursing notes.  Pertinent labs & imaging results that were available during my care of the patient were reviewed by me and considered in my medical decision making (see chart for details).     Suggested imaging of chest, but there is no x-ray tech available today.  Patient declined outpatient imaging.  Will prescribe amoxicillin and prednisone steroid course to help alleviate symptoms.  Advised patient to go to the hospital if shortness of breath develops. Discussed strict return precautions. Patient verbalized understanding and is agreeable with plan.  Final Clinical Impressions(s) / UC Diagnoses   Final diagnoses:  Acute upper respiratory infection  Persistent cough     Discharge Instructions      You are being treated with amoxicillin antibiotic, prednisone steroid, cetirizine antihistamine, and guaifenesin cough medication to treat upper respiratory infection. Follow up with Primary care if symptoms do not improve.      ED Prescriptions     Medication Sig Dispense Auth. Provider   amoxicillin (AMOXIL) 875 MG tablet Take 1 tablet (875 mg total) by mouth 2 (two) times daily for 10 days. 20 tablet Odis Luster, FNP   predniSONE (DELTASONE) 20 MG tablet Take 2 tablets (40 mg total) by mouth daily for 5 days. 10 tablet Odis Luster, FNP   guaiFENesin 200 MG tablet Take 1 tablet (200 mg total) by mouth every 4 (four) hours as needed for cough or to loosen phlegm. 30 suppository Odis Luster, FNP   cetirizine (ZYRTEC) 10 MG tablet Take 1 tablet (10 mg total) by mouth daily. 30 tablet Odis Luster, FNP      PDMP not reviewed this  encounter.   Odis Luster, FNP 02/22/21 1122

## 2021-02-22 NOTE — ED Triage Notes (Signed)
15 day h/o puffiness under bilateral eyes and 10 days of cough and congestion. Has been taking Alka Seltzer and  OTC allergy meds.  Negative at home covid test on 8/25. Denies abdominal pain, n/v/d/r.

## 2021-02-22 NOTE — Discharge Instructions (Addendum)
You are being treated with amoxicillin antibiotic, prednisone steroid, cetirizine antihistamine, and guaifenesin cough medication to treat upper respiratory infection. Follow up with Primary care if symptoms do not improve.

## 2021-03-09 ENCOUNTER — Telehealth: Payer: Self-pay | Admitting: Family Medicine

## 2021-03-09 DIAGNOSIS — L708 Other acne: Secondary | ICD-10-CM

## 2021-03-09 DIAGNOSIS — E785 Hyperlipidemia, unspecified: Secondary | ICD-10-CM

## 2021-03-09 MED ORDER — SPIRONOLACTONE 50 MG PO TABS: 50.0000 mg | ORAL_TABLET | Freq: Every day | ORAL | 0 refills | Status: DC

## 2021-03-09 MED ORDER — ATORVASTATIN CALCIUM 10 MG PO TABS: 10.0000 mg | ORAL_TABLET | Freq: Every day | ORAL | 0 refills | Status: DC

## 2021-03-09 NOTE — Telephone Encounter (Signed)
Refill request for: Spiroloncatone 50 mg LR 02/29/20, #90, 3 rfs  Atorvastatin 10 mg LR 02/29/20, #90. 3 rf LOV 06/2020 FOV  06/05/21  Please review and advise. Thanks. Dm/cma

## 2021-03-09 NOTE — Telephone Encounter (Signed)
Pt needing refills on Spironlactone and atorvastatin - pt is currently out.   Last saw Dr. Bryan Lemma 06/2020. Appt scheduled for TOC to Dr. Gena Fray.   Please advise on med refills  [place], [place] - [place] Phone:  [phone]  Fax:  [phone]

## 2021-04-01 LAB — HM MAMMOGRAPHY

## 2021-04-02 ENCOUNTER — Encounter: Payer: Self-pay | Admitting: Family Medicine

## 2021-05-13 LAB — HEMOGLOBIN A1C: Hemoglobin A1C: 5.7

## 2021-05-14 LAB — HEPATIC FUNCTION PANEL
ALT: 31 (ref 7–35)
AST: 26 (ref 13–35)

## 2021-05-14 LAB — TSH: TSH: 0.92 (ref <=5.90)

## 2021-05-14 LAB — BASIC METABOLIC PANEL
BUN: 14 (ref 4–21)
Creatinine: 0.8 (ref <=1.1)

## 2021-05-14 LAB — LIPID PANEL
Cholesterol: 236 — AB (ref 0–200)
HDL: 84 — AB (ref 35–70)
LDL Cholesterol: 145
Triglycerides: 33 — AB (ref 40–160)

## 2021-06-05 ENCOUNTER — Ambulatory Visit: Payer: 59 | Admitting: Family Medicine

## 2021-06-05 ENCOUNTER — Encounter: Payer: Self-pay | Admitting: Family Medicine

## 2021-06-05 ENCOUNTER — Other Ambulatory Visit: Payer: Self-pay

## 2021-06-05 VITALS — BP 118/76 | HR 72 | Temp 97.3°F | Ht 60.0 in | Wt 132.0 lb

## 2021-06-05 DIAGNOSIS — R7303 Prediabetes: Secondary | ICD-10-CM | POA: Diagnosis not present

## 2021-06-05 DIAGNOSIS — Z23 Encounter for immunization: Secondary | ICD-10-CM

## 2021-06-05 DIAGNOSIS — E559 Vitamin D deficiency, unspecified: Secondary | ICD-10-CM

## 2021-06-05 DIAGNOSIS — E785 Hyperlipidemia, unspecified: Secondary | ICD-10-CM

## 2021-06-05 DIAGNOSIS — Z780 Asymptomatic menopausal state: Secondary | ICD-10-CM | POA: Insufficient documentation

## 2021-06-05 MED ORDER — VITAMIN D (ERGOCALCIFEROL) 1.25 MG (50000 UNIT) PO CAPS: 50000.0000 [IU] | ORAL_CAPSULE | ORAL | 1 refills | Status: DC

## 2021-06-05 MED ORDER — ATORVASTATIN CALCIUM 20 MG PO TABS: 10.0000 mg | ORAL_TABLET | Freq: Every day | ORAL | 3 refills | Status: DC

## 2021-06-05 NOTE — Progress Notes (Signed)
[facility] [address] [place] [address] Dept: [phone] Dept Fax: [phone]  Transfer of Care Office Visit  Subjective:    Patient ID: Patricia Dunlap, female    DOB: August 16, 1965, 55 y.o..   MRN: [id number]  Chief Complaint  Patient presents with   Establish Care    [facility]- establish care.   C/o still having some low back pain radiating down both legs (seen Ortho in the summer).      History of Present Illness:  Patient is in today to establish care. Patricia Dunlap was born in [place] and grew up in [place] [employer] She attended [employer] in [place], Chemical engineer in business. She has been married to [place] for 30 years. She has one child (age 70) and three grandchildren. Patricia Dunlap worked for [employer] for 30 years, before retiring in 2020. She is now back with them part-time, as a Leisure centre manager. Patricia Dunlap husband has a progressive neurological disorder that is requiring more of her assistance with ADLs and IADLs with time.  Patricia Dunlap has a history of perrenial allergies. She takes Zyrtec for management of this.  Patricia Dunlap has a history fo acne. She is on spironolactone and feels this has improved her skin.  Patricia Dunlap has a history of hyperlipidemia. She is managed on atorvastatin. She had lab work performed recently at [facility] which showed her cholesterol still high.  Patricia Dunlap has a history fo a Vitamin D deficiency. She is currently on an OTC maintenance dose. She had her level recently checked, which was low again.  Past Medical History: Patient Active Problem List   Diagnosis Date Noted   Menopause 06/05/2021   Vitamin D deficiency 06/05/2021   HLD (hyperlipidemia) 11/28/2017   Vaginal atrophy 11/28/2017   Acne 11/28/2017   Allergic rhinitis 09/16/2010   Heart murmur 09/16/2010   Past Surgical History:  Procedure Laterality Date   ABDOMINAL HYSTERECTOMY      BUNIONECTOMY     WISDOM TOOTH EXTRACTION     age 3   Family History  Problem Relation Age of Onset   Hyperlipidemia Mother    Hypertension Father    Other Father        Supranuclear palsy   Colon cancer Neg Hx    Outpatient Medications Prior to Visit  Medication Sig Dispense Refill   cetirizine (ZYRTEC) 10 MG tablet Take 1 tablet (10 mg total) by mouth daily. 30 tablet 0   guaiFENesin 200 MG tablet Take 1 tablet (200 mg total) by mouth every 4 (four) hours as needed for cough or to loosen phlegm. 30 suppository 0   Multiple Vitamins-Minerals (MULTIVITAMIN WITH MINERALS) tablet Take 1 tablet by mouth daily.     naproxen (NAPROSYN) 500 MG tablet Take 1 tablet (500 mg total) by mouth 2 (two) times daily with a meal. 60 tablet 0   RESTASIS 0.05 % ophthalmic emulsion      spironolactone (ALDACTONE) 50 MG tablet Take 1 tablet (50 mg total) by mouth daily. 90 tablet 0   atorvastatin (LIPITOR) 10 MG tablet Take 1 tablet (10 mg total) by mouth daily. 90 tablet 0   Cholecalciferol (VITAMIN D) 125 MCG (5000 UT) CAPS Take 2,000 mg by mouth.     PREMARIN 0.625 MG tablet Take 0.625 mg by mouth daily.     nystatin cream (MYCOSTATIN) Apply 1 application topically 2 (two) times daily. 30 g 1   OSPHENA 60 MG TABS Take  1 tablet by mouth daily.     triamcinolone (KENALOG) 0.1 % Apply 1 application topically 2 (two) times daily. 30 g 1   0.9 %  sodium chloride infusion      No facility-administered medications prior to visit.   No Known Allergies    Objective:   Today's Vitals   06/05/21 0835  BP: 118/76  Pulse: 72  Temp: (!) 97.3 F (36.3 C)  TempSrc: Temporal  SpO2: 98%  Weight: 132 lb (59.9 kg)  Height: 5' (1.524 m)   Body mass index is 25.78 kg/m.   General: Well developed, well nourished. No acute distress. Psych: Alert and oriented. Normal mood and affect.  Health Maintenance Due  Topic Date Due   HIV Screening  Never done   Hepatitis C Screening  Never done   Zoster  Vaccines- Shingrix (1 of 2) Never done   COVID-19 Vaccine (5 - Booster for Pfizer series) 02/16/2021   Lab Results Labs performed at [facility] MD (05/13/2021)  %HbA1c: 5.7% Glucose: 96 mg/dL  Cholesterol: 236 mg/dL Triglycerides: 33 mg/dL HDL Cholesterol: 84 mg/dL LDL Cholesterol: 145 mg/dL  Vitamin D: 14.8 ng/mL    Assessment & Plan:   1. Hyperlipidemia, unspecified hyperlipidemia type LDL is above goal. Patricia Dunlap is on a very low dose of atorvastatin. I will increase her dose to 20 mg and reassess in 6 months.  - atorvastatin (LIPITOR) 20 MG tablet; Take 0.5 tablets (10 mg total) by mouth daily.  Dispense: 90 tablet; Refill: 3  2. Vitamin D deficiency Patricia Dunlap's Vit D is low, despite maintenance therapy. I will switch her back to a replacement dose and plan to reassess in 56 months.  - Vitamin D, Ergocalciferol, (DRISDOL) 1.25 MG (50000 UNIT) CAPS capsule; Take 1 capsule (50,000 Units total) by mouth every 7 (seven) days.  Dispense: 5 capsule; Refill: 1  3. Prediabetes Plan to reassess yearly.  Patricia Salter, MD

## 2021-06-25 ENCOUNTER — Other Ambulatory Visit: Payer: Self-pay | Admitting: Family

## 2021-06-25 DIAGNOSIS — E785 Hyperlipidemia, unspecified: Secondary | ICD-10-CM

## 2021-06-25 DIAGNOSIS — L708 Other acne: Secondary | ICD-10-CM

## 2021-07-09 ENCOUNTER — Other Ambulatory Visit: Payer: Self-pay | Admitting: Family

## 2021-07-09 DIAGNOSIS — L708 Other acne: Secondary | ICD-10-CM

## 2021-07-10 ENCOUNTER — Other Ambulatory Visit: Payer: Self-pay

## 2021-07-10 DIAGNOSIS — L708 Other acne: Secondary | ICD-10-CM

## 2021-07-10 MED ORDER — SPIRONOLACTONE 50 MG PO TABS: 50.0000 mg | ORAL_TABLET | Freq: Every day | ORAL | 3 refills | Status: DC

## 2021-07-10 NOTE — Telephone Encounter (Signed)
Refill request for: Spirolonolactone 50 mg LR 03/09/21, #90, 0 rf LOV 06/05/21 FOV 12/10/21  Please review and advise.  Thanks. Dm/cma

## 2021-07-17 ENCOUNTER — Other Ambulatory Visit: Payer: Self-pay | Admitting: Physical Medicine & Rehabilitation

## 2021-07-17 DIAGNOSIS — M5416 Radiculopathy, lumbar region: Secondary | ICD-10-CM

## 2021-07-25 ENCOUNTER — Other Ambulatory Visit: Payer: Self-pay | Admitting: Family

## 2021-07-25 ENCOUNTER — Other Ambulatory Visit: Payer: 59

## 2021-07-25 DIAGNOSIS — E785 Hyperlipidemia, unspecified: Secondary | ICD-10-CM

## 2021-07-28 ENCOUNTER — Other Ambulatory Visit: Payer: Self-pay

## 2021-07-28 ENCOUNTER — Ambulatory Visit
Admission: RE | Admit: 2021-07-28 | Discharge: 2021-07-28 | Disposition: A | Discharge status: Home / Self Care | Payer: 59 | Source: Ambulatory Visit | Attending: Physical Medicine & Rehabilitation | Admitting: Physical Medicine & Rehabilitation

## 2021-07-28 DIAGNOSIS — M5416 Radiculopathy, lumbar region: Secondary | ICD-10-CM

## 2021-08-06 ENCOUNTER — Ambulatory Visit (INDEPENDENT_AMBULATORY_CARE_PROVIDER_SITE_OTHER): Payer: 59

## 2021-08-06 ENCOUNTER — Other Ambulatory Visit: Payer: Self-pay

## 2021-08-06 DIAGNOSIS — Z23 Encounter for immunization: Secondary | ICD-10-CM | POA: Diagnosis not present

## 2021-08-06 NOTE — Progress Notes (Signed)
Per the orders of Dr. Gena Fray pt is here for second dose of shingle vaccine, pt received vaccine in left deltoid at 10:10 am given by [place] CMA/CPT. Pt tolerated injection well.

## 2021-08-10 ENCOUNTER — Other Ambulatory Visit: Payer: Self-pay

## 2021-08-10 DIAGNOSIS — E559 Vitamin D deficiency, unspecified: Secondary | ICD-10-CM

## 2021-08-10 MED ORDER — VITAMIN D (ERGOCALCIFEROL) 1.25 MG (50000 UNIT) PO CAPS: 50000.0000 [IU] | ORAL_CAPSULE | ORAL | 2 refills | Status: DC

## 2021-08-31 ENCOUNTER — Other Ambulatory Visit: Payer: Self-pay

## 2021-09-01 ENCOUNTER — Ambulatory Visit: Payer: 59 | Admitting: Family Medicine

## 2021-09-01 VITALS — BP 120/78 | HR 85 | Temp 97.9°F | Ht 60.0 in | Wt 125.8 lb

## 2021-09-01 DIAGNOSIS — L819 Disorder of pigmentation, unspecified: Secondary | ICD-10-CM

## 2021-09-01 DIAGNOSIS — K219 Gastro-esophageal reflux disease without esophagitis: Secondary | ICD-10-CM

## 2021-09-01 NOTE — Progress Notes (Signed)
?[facility] ?[place] ?[place] [address] ?Dept: [phone] ?Dept Fax: [phone] ? ?Office Visit ? ?Subjective:  ? ? Patient ID: Patricia Dunlap, female    DOB: 1966-04-06, 56 y.o..   MRN: [id number] ? ?Chief Complaint  ?Patient presents with  ? Acute Visit  ?  C/o having pain in the LT side chest/mid chest area x 2 weeks.  She has taken Omeprazole with some relief.   ? ? ?History of Present Illness: ? ?Patient is in today for evaluation of a 2-week history of some intermittent left upper abdominal and left mid chest discomfort. She describes this as feeling sore or tight in these areas. The discomfort can occur at rest or with activity. She denies any dyspnea, diaphoresis, or nausea. She took omeprazole and did have improvement of her symptoms. When she went off of this, she return of her symptoms a day or two later.  She does not use tobacco. She does drink some caffeine and she eat peppermints.  ? ?Additionally, Ms. Barbier notes an increase in pigmentation over the outer aspect of both upper arms. She associates this with the sites she received the Shingrix vaccine. There is no soreness or itching present. ? ?Past Medical History: ?Patient Active Problem List  ? Diagnosis Date Noted  ? Menopause 06/05/2021  ? Vitamin D deficiency 06/05/2021  ? Prediabetes 06/05/2021  ? HLD (hyperlipidemia) 11/28/2017  ? Vaginal atrophy 11/28/2017  ? Acne 11/28/2017  ? Allergic rhinitis 09/16/2010  ? Heart murmur 09/16/2010  ? ?Past Surgical History:  ?Procedure Laterality Date  ? ABDOMINAL HYSTERECTOMY    ? BUNIONECTOMY    ? WISDOM TOOTH EXTRACTION    ? age 20  ? ?Family History  ?Problem Relation Age of Onset  ? Hyperlipidemia Mother   ? Hypertension Father   ? Other Father   ?     Supranuclear palsy  ? Colon cancer Neg Hx   ? ?Outpatient Medications Prior to Visit  ?Medication Sig Dispense Refill  ? atorvastatin (LIPITOR) 20 MG tablet Take 0.5 tablets (10 mg  total) by mouth daily. 90 tablet 3  ? cetirizine (ZYRTEC) 10 MG tablet Take 1 tablet (10 mg total) by mouth daily. 30 tablet 0  ? guaiFENesin 200 MG tablet Take 1 tablet (200 mg total) by mouth every 4 (four) hours as needed for cough or to loosen phlegm. 30 suppository 0  ? meloxicam (MOBIC) 15 MG tablet Take 15 mg by mouth daily.    ? Multiple Vitamins-Minerals (MULTIVITAMIN WITH MINERALS) tablet Take 1 tablet by mouth daily.    ? naproxen (NAPROSYN) 500 MG tablet Take 1 tablet (500 mg total) by mouth 2 (two) times daily with a meal. 60 tablet 0  ? RESTASIS 0.05 % ophthalmic emulsion     ? spironolactone (ALDACTONE) 50 MG tablet Take 1 tablet (50 mg total) by mouth daily. 90 tablet 3  ? Vitamin D, Ergocalciferol, (DRISDOL) 1.25 MG (50000 UNIT) CAPS capsule Take 1 capsule (50,000 Units total) by mouth every 7 (seven) days. 5 capsule 2  ? ?No facility-administered medications prior to visit.  ? ?No Known Allergies ?   ?Objective:  ? ?Today's Vitals  ? 09/01/21 1518  ?BP: 120/78  ?Pulse: 85  ?Temp: 97.9 ?F (36.6 ?C)  ?TempSrc: Temporal  ?SpO2: 98%  ?Weight: 125 lb 12.8 oz (57.1 kg)  ?Height: 5' (1.524 m)  ? ?Body mass index is 24.57 kg/m?.  ? ?General: Well developed, well nourished. No acute  distress. ?HEENT: Normocephalic, non-traumatic. Conjunctiva clear. External ears normal. EAC and  ? TMs normal bilaterally. Mucous membranes moist. Oropharynx clear. Good dentition. ?Neck: Supple. No lymphadenopathy. No thyromegaly. ?Lungs: Clear to auscultation bilaterally. No wheezing, rales or rhonchi. ?CV: RRR without murmurs or rubs. Pulses 2+ bilaterally. ?Chest wall: Mild tenderness with palpation over the chest wall/upper breast margin. ?Abdomen: Soft, non-tender. Bowel sounds positive, normal pitch and frequency. No  ? hepatosplenomegaly. No rebound or guarding. ?Skin: Round slightly hyperpigmented area over the lateral aspect of both upper arms. ?Psych: Alert and oriented. Normal mood and affect. ? ?Health Maintenance  Due  ?Topic Date Due  ? HIV Screening  Never done  ? Hepatitis C Screening  Never done  ?   ?Assessment & Plan:  ? ?1. Gastroesophageal reflux disease without esophagitis ?Ms. Loiseau's symptoms and her response to omeprazole suggests that this is GERD she is experiencing. I recommend she continue Prilosec 20 mg daily for a month. She should then step down to Pepcid [facility] for 2 weeks before stopping. I did caution her about symptoms of cardiac chest pain and to seek immediate care should this occur. ? ?2. Hyperpigmentation of skin ?I do appreciate the areas of hyperpigmentation. This was likely a reaction to her immunization. We discussed possibly using a topical agent to lighten the skin, but she doesn't seem to feel that is necessary. ? ?Return if symptoms worsen or fail to improve.  ? ?Haydee Salter, MD ?

## 2021-09-01 NOTE — Patient Instructions (Signed)
Gastroesophageal Reflux Disease, Adult ?Gastroesophageal reflux (GER) happens when acid from the stomach flows up into the tube that connects the mouth and the stomach (esophagus). Normally, food travels down the esophagus and stays in the stomach to be digested. However, when a person has GER, food and stomach acid sometimes move back up into the esophagus. If this becomes a more serious problem, the person may be diagnosed with a disease called gastroesophageal reflux disease (GERD). GERD occurs when the reflux: ?Happens often. ?Causes frequent or severe symptoms. ?Causes problems such as damage to the esophagus. ?When stomach acid comes in contact with the esophagus, the acid may cause inflammation in the esophagus. Over time, GERD may create small holes (ulcers) in the lining of the esophagus. ?What are the causes? ?This condition is caused by a problem with the muscle between the esophagus and the stomach (lower esophageal sphincter, or LES). Normally, the LES muscle closes after food passes through the esophagus to the stomach. When the LES is weakened or abnormal, it does not close properly, and that allows food and stomach acid to go back up into the esophagus. ?The LES can be weakened by certain dietary substances, medicines, and medical conditions, including: ?Tobacco use. ?Pregnancy. ?Having a hiatal hernia. ?Alcohol use. ?Certain foods and beverages, such as coffee, chocolate, onions, and peppermint. ?What increases the risk? ?You are more likely to develop this condition if you: ?Have an increased body weight. ?Have a connective tissue disorder. ?Take NSAIDs, such as ibuprofen. ?What are the signs or symptoms? ?Symptoms of this condition include: ?Heartburn. ?Difficult or painful swallowing and the feeling of having a lump in the throat. ?A bitter taste in the mouth. ?Bad breath and having a large amount of saliva. ?Having an upset or bloated stomach and belching. ?Chest pain. Different conditions can  cause chest pain. Make sure you see your health care provider if you experience chest pain. ?Shortness of breath or wheezing. ?Ongoing (chronic) cough or a nighttime cough. ?Wearing away of tooth enamel. ?Weight loss. ?How is this diagnosed? ?This condition may be diagnosed based on a medical history and a physical exam. To determine if you have mild or severe GERD, your health care provider may also monitor how you respond to treatment. You may also have tests, including: ?A test to examine your stomach and esophagus with a small camera (endoscopy). ?A test that measures the acidity level in your esophagus. ?A test that measures how much pressure is on your esophagus. ?A barium swallow or modified barium swallow test to show the shape, size, and functioning of your esophagus. ?How is this treated? ?Treatment for this condition may vary depending on how severe your symptoms are. Your health care provider may recommend: ?Changes to your diet. ?Medicine. ?Surgery. ?The goal of treatment is to help relieve your symptoms and to prevent complications. ?Follow these instructions at home: ?Eating and drinking ? ?Follow a diet as recommended by your health care provider. This may involve avoiding foods and drinks such as: ?Coffee and tea, with or without caffeine. ?Drinks that contain alcohol. ?Energy drinks and sports drinks. ?Carbonated drinks or sodas. ?Chocolate and cocoa. ?Peppermint and mint flavorings. ?Garlic and onions. ?Horseradish. ?Spicy and acidic foods, including peppers, chili powder, curry powder, vinegar, hot sauces, and barbecue sauce. ?Citrus fruit juices and citrus fruits, such as oranges, lemons, and limes. ?Tomato-based foods, such as red sauce, chili, salsa, and pizza with red sauce. ?Fried and fatty foods, such as donuts, french fries, potato chips, and high-fat dressings. ?  High-fat meats, such as hot dogs and fatty cuts of red and white meats, such as rib eye steak, sausage, ham, and  bacon. ?High-fat dairy items, such as whole milk, butter, and cream cheese. ?Eat small, frequent meals instead of large meals. ?Avoid drinking large amounts of liquid with your meals. ?Avoid eating meals during the 2-3 hours before bedtime. ?Avoid lying down right after you eat. ?Do not exercise right after you eat. ?Lifestyle ? ?Do not use any products that contain nicotine or tobacco. These products include cigarettes, chewing tobacco, and vaping devices, such as e-cigarettes. If you need help quitting, ask your health care provider. ?Try to reduce your stress by using methods such as yoga or meditation. If you need help reducing stress, ask your health care provider. ?If you are overweight, reduce your weight to an amount that is healthy for you. Ask your health care provider for guidance about a safe weight loss goal. ?General instructions ?Pay attention to any changes in your symptoms. ?Take over-the-counter and prescription medicines only as told by your health care provider. Do not take aspirin, ibuprofen, or other NSAIDs unless your health care provider told you to take these medicines. ?Wear loose-fitting clothing. Do not wear anything tight around your waist that causes pressure on your abdomen. ?Raise (elevate) the head of your bed about 6 inches (15 cm). You can use a wedge to do this. ?Avoid bending over if this makes your symptoms worse. ?Keep all follow-up visits. This is important. ?Contact a health care provider if: ?You have: ?New symptoms. ?Unexplained weight loss. ?Difficulty swallowing or it hurts to swallow. ?Wheezing or a persistent cough. ?A hoarse voice. ?Your symptoms do not improve with treatment. ?Get help right away if: ?You have sudden pain in your arms, neck, jaw, teeth, or back. ?You suddenly feel sweaty, dizzy, or light-headed. ?You have chest pain or shortness of breath. ?You vomit and the vomit is green, yellow, or black, or it looks like blood or coffee grounds. ?You faint. ?You  have stool that is red, bloody, or black. ?You cannot swallow, drink, or eat. ?These symptoms may represent a serious problem that is an emergency. Do not wait to see if the symptoms will go away. Get medical help right away. Call your local emergency services (911 in the [place] Do not drive yourself to the hospital. ?Summary ?Gastroesophageal reflux happens when acid from the stomach flows up into the esophagus. GERD is a disease in which the reflux happens often, causes frequent or severe symptoms, or causes problems such as damage to the esophagus. ?Treatment for this condition may vary depending on how severe your symptoms are. Your health care provider may recommend diet and lifestyle changes, medicine, or surgery. ?Contact a health care provider if you have new or worsening symptoms. ?Take over-the-counter and prescription medicines only as told by your health care provider. Do not take aspirin, ibuprofen, or other NSAIDs unless your health care provider told you to do so. ?Keep all follow-up visits as told by your health care provider. This is important. ?This information is not intended to replace advice given to you by your health care provider. Make sure you discuss any questions you have with your health care provider. ?Document Revised: 12/24/2019 Document Reviewed: 12/24/2019 ?Elsevier Patient Education ? 2022 [employer] ? ?

## 2021-09-24 ENCOUNTER — Encounter (HOSPITAL_COMMUNITY): Payer: Self-pay | Admitting: Emergency Medicine

## 2021-09-24 ENCOUNTER — Emergency Department (HOSPITAL_COMMUNITY): Payer: 59

## 2021-09-24 ENCOUNTER — Emergency Department (HOSPITAL_COMMUNITY)
Admission: EM | Admit: 2021-09-24 | Discharge: 2021-09-24 | Disposition: A | Discharge status: Home / Self Care | Payer: 59 | Attending: Emergency Medicine | Admitting: Emergency Medicine

## 2021-09-24 DIAGNOSIS — S61211A Laceration without foreign body of left index finger without damage to nail, initial encounter: Secondary | ICD-10-CM | POA: Insufficient documentation

## 2021-09-24 DIAGNOSIS — Z23 Encounter for immunization: Secondary | ICD-10-CM | POA: Insufficient documentation

## 2021-09-24 DIAGNOSIS — S60413A Abrasion of left middle finger, initial encounter: Secondary | ICD-10-CM | POA: Diagnosis not present

## 2021-09-24 DIAGNOSIS — S61432A Puncture wound without foreign body of left hand, initial encounter: Secondary | ICD-10-CM | POA: Diagnosis not present

## 2021-09-24 DIAGNOSIS — W540XXA Bitten by dog, initial encounter: Secondary | ICD-10-CM | POA: Diagnosis not present

## 2021-09-24 DIAGNOSIS — S6992XA Unspecified injury of left wrist, hand and finger(s), initial encounter: Secondary | ICD-10-CM | POA: Diagnosis present

## 2021-09-24 MED ORDER — TETANUS-DIPHTH-ACELL PERTUSSIS 5-2.5-18.5 LF-MCG/0.5 IM SUSY
0.5000 mL | PREFILLED_SYRINGE | Freq: Once | INTRAMUSCULAR | Status: AC
  Administered 2021-09-24: 0.5 mL via INTRAMUSCULAR
  Filled 2021-09-24: qty 0.5

## 2021-09-24 MED ORDER — AMOXICILLIN-POT CLAVULANATE 875-125 MG PO TABS
1.0000 | ORAL_TABLET | Freq: Once | ORAL | Status: AC
  Administered 2021-09-24: 1 via ORAL
  Filled 2021-09-24: qty 1

## 2021-09-24 MED ORDER — BACITRACIN ZINC 500 UNIT/GM EX OINT
TOPICAL_OINTMENT | Freq: Two times a day (BID) | CUTANEOUS | Status: DC
  Administered 2021-09-24: 1 via TOPICAL
  Filled 2021-09-24: qty 0.9

## 2021-09-24 MED ORDER — AMOXICILLIN-POT CLAVULANATE 875-125 MG PO TABS: 1.0000 | ORAL_TABLET | Freq: Two times a day (BID) | ORAL | 0 refills | Status: AC

## 2021-09-24 MED ORDER — ACETAMINOPHEN 500 MG PO TABS
1000.0000 mg | ORAL_TABLET | Freq: Once | ORAL | Status: AC
Start: 2021-09-24 — End: 2021-09-24
  Administered 2021-09-24: 1000 mg via ORAL
  Filled 2021-09-24: qty 2

## 2021-09-24 NOTE — Discharge Instructions (Addendum)
You are seen in the ER today for dog bite.  You have been prescribed antibiotics to try to prevent infection.  Please take these as prescribed for the entire course and return to the ER with any new swelling, redness, puslike drainage from the area, or any other new severe symptoms ?

## 2021-09-24 NOTE — ED Provider Notes (Signed)
?[facility] ?Provider Note ? ? ?CSN: [id number] ?Arrival date & time: 09/24/21  1658 ? ?  ? ?History ? ?Chief Complaint  ?Patient presents with  ? Animal Bite  ? ? ?Patricia Dunlap is a 56 y.o. female who presents after being bitten by dog in her neighborhood today.  She was walking her dog when one of her neighbors dogs came out and became aggressive and bit her hand.  The dog's owner was able to forward documentation of the dogs up-to-date on rabies immunization which she showed to this provider. ? ?Patient received updated tetanus vaccination in triage. ?I have personally reviewed her medical records.  Has history hyperlipidemia, menopause, anemia, and heart murmur in the past.  She is not anticoagulated.  She is not immunocompromised. ? ?HPI ? ?  ? ?Home Medications ?Prior to Admission medications   ?Medication Sig Start Date End Date Taking? Authorizing Provider  ?amoxicillin-clavulanate (AUGMENTIN) 875-125 MG tablet Take 1 tablet by mouth every 12 (twelve) hours for 5 days. 09/24/21 09/29/21 Yes Myna Freimark, Eugene Garnet R, PA-C  ?atorvastatin (LIPITOR) 20 MG tablet Take 0.5 tablets (10 mg total) by mouth daily. 06/05/21   Haydee Salter, MD  ?cetirizine (ZYRTEC) 10 MG tablet Take 1 tablet (10 mg total) by mouth daily. 02/22/21   Teodora Medici, FNP  ?guaiFENesin 200 MG tablet Take 1 tablet (200 mg total) by mouth every 4 (four) hours as needed for cough or to loosen phlegm. 02/22/21   Teodora Medici, FNP  ?meloxicam (MOBIC) 15 MG tablet Take 15 mg by mouth daily. 08/14/21   [provider]  ?Multiple Vitamins-Minerals (MULTIVITAMIN WITH MINERALS) tablet Take 1 tablet by mouth daily.    [provider]  ?naproxen (NAPROSYN) 500 MG tablet Take 1 tablet (500 mg total) by mouth 2 (two) times daily with a meal. 07/09/20   Cirigliano, Garvin Fila, DO  ?RESTASIS 0.05 % ophthalmic emulsion  01/26/20   [provider]  ?spironolactone (ALDACTONE) 50 MG tablet Take 1  tablet (50 mg total) by mouth daily. 07/10/21   Haydee Salter, MD  ?Vitamin D, Ergocalciferol, (DRISDOL) 1.25 MG (50000 UNIT) CAPS capsule Take 1 capsule (50,000 Units total) by mouth every 7 (seven) days. 08/10/21   Haydee Salter, MD  ?   ? ?Allergies    ?Patient has no known allergies.   ? ?Review of Systems   ?Review of Systems  ?Skin:  Positive for wound.  ?All other systems reviewed and are negative. ? ?Physical Exam ?Updated Vital Signs ?BP 140/86   Pulse 80   Temp 97.9 ?F (36.6 ?C) (Oral)   Resp 17   SpO2 100%  ?Physical Exam ?Vitals and nursing note reviewed.  ?Constitutional:   ?   Appearance: She is not ill-appearing or toxic-appearing.  ?HENT:  ?   Head: Normocephalic and atraumatic.  ?Eyes:  ?   General: No scleral icterus.    ?   Right eye: No discharge.     ?   Left eye: No discharge.  ?   Conjunctiva/sclera: Conjunctivae normal.  ?Pulmonary:  ?   Effort: Pulmonary effort is normal.  ?Musculoskeletal:  ?     Hands: ? ?   Comments: Bruising over the dorsum of the left hand particularly over the left middle finger  ?Skin: ?   General: Skin is warm and dry.  ?   Capillary Refill: Capillary refill takes less than 2 seconds.  ?   Findings: Abrasion and laceration  present.  ?Neurological:  ?   General: No focal deficit present.  ?   Mental Status: She is alert.  ?Psychiatric:     ?   Mood and Affect: Mood normal.  ? ? ?ED Results / Procedures / Treatments   ?Labs ?(all labs ordered are listed, but only abnormal results are displayed) ?Labs Reviewed - No data to display ? ?EKG ?None ? ?Radiology ?DG Hand Complete Left ? ?Result Date: 09/24/2021 ?CLINICAL DATA:  A 56 year old female presents with history of dog bite. EXAM: LEFT HAND - COMPLETE 3+ VIEW COMPARISON:  None FINDINGS: Dressing material is present over the hand limiting bony detail. No radiopaque foreign body is present in the soft tissues. No sign of acute fracture or dislocation. Soft tissue swelling is suspected over the palmar aspect of the  hand. IMPRESSION: 1. No acute fracture or dislocation. 2. Soft tissue swelling suspected over the palmar aspect of the hand. 3. No radiopaque foreign body in the soft tissues. Electronically Signed   By: Zetta Bills M.D.   On: 09/24/2021 18:59   ? ?Procedures ?Procedures  ? ? ?Medications Ordered in ED ?Medications  ?bacitracin ointment (1 application. Topical Given 09/24/21 2033)  ?acetaminophen (TYLENOL) tablet 1,000 mg (1,000 mg Oral Given 09/24/21 1858)  ?Tdap (BOOSTRIX) injection 0.5 mL (0.5 mLs Intramuscular Given 09/24/21 1859)  ?amoxicillin-clavulanate (AUGMENTIN) 875-125 MG per tablet 1 tablet (1 tablet Oral Given 09/24/21 2033)  ? ? ?ED Course/ Medical Decision Making/ A&P ?  ?                        ?Medical Decision Making ?56 year old female with dog bite.  Animal up-to-date on its rabies vaccination. ? ?Hypertensive on intake, vital signs otherwise normal.  Cardiopulmonary and abdominal signs are benign.  Patient is neurovascular intact in all 4 extremities and in all digits of the left hand.  Lacerations as above.  ? ?These were all thoroughly irrigated in the emergency department and dressed with bacitracin.  Patient's tetanus vaccination was updated in the ED.  First dose of antibiotics was administered. ? ?Amount and/or Complexity of Data Reviewed ?Radiology:  ?   Details: Plain film of the left hand without acute fracture or dislocation. ? ?Risk ?OTC drugs. ?Prescription drug management. ? ? ?No further work-up warranted near this time.  No indication for rabies immunization or immunoglobulin at this time.  Discharged with antibiotic prescription. ? ?Janya voiced understanding of her medical evaluation and treatment plan. Each of their questions answered to their expressed satisfaction.  Return precautions were given.  Patient is well-appearing, stable, and was discharged in good condition. ? ?This chart was dictated using voice recognition software, Dragon. Despite the best efforts of this  provider to proofread and correct errors, errors may still occur which can change documentation meaning. ? ?Final Clinical Impression(s) / ED Diagnoses ?Final diagnoses:  ?Dog bite, initial encounter  ? ? ?Rx / DC Orders ?ED Discharge Orders   ? ?      Ordered  ?  amoxicillin-clavulanate (AUGMENTIN) 875-125 MG tablet  Every 12 hours       ? 09/24/21 2011  ? ?  ?  ? ?  ? ? ?  ?Emeline Darling, PA-C ?09/24/21 2252 ? ?  ?Gareth Morgan, MD ?09/26/21 1229 ? ?

## 2021-09-24 NOTE — ED Provider Triage Note (Signed)
Emergency Medicine Provider Triage Evaluation Note ? ?Patricia Dunlap , a 56 y.o. female  was evaluated in triage.  Pt complains of left hand injury after dog bite. Pt states she spoke with the dog owner and they sent over dog's vaccination records. It occurred about 30 min prior to ER arrival.  ? ?Review of Systems  ?Positive: Hand injury ?Negative: Numbness, weakness ? ?Physical Exam  ?BP (!) 155/89 (BP Location: Right Arm)   Pulse 99   Temp 98.1 ?F (36.7 ?C) (Oral)   Resp 16   SpO2 100%  ?Gen:   Awake, no distress   ?Resp:  Normal effort  ?MSK:   Moves extremities without difficulty  ?Other:  Two puncture wounds noted to the dorsum of the left hand, superficial abrasions over the pointer and middle fingers ? ?Medical Decision Making  ?Medically screening exam initiated at 5:28 PM.  Appropriate orders placed.  Patricia Dunlap was informed that the remainder of the evaluation will be completed by another provider, this initial triage assessment does not replace that evaluation, and the importance of remaining in the ED until their evaluation is complete. ? ? ?  ?Kateri Plummer, PA-C ?09/24/21 1729 ? ?

## 2021-09-24 NOTE — ED Notes (Signed)
Pt has a puncture wound on the inner hand, in between the 1st and 2nd digits of the left hand ?

## 2021-09-24 NOTE — ED Triage Notes (Signed)
Patient here after being bitten by a dog on her left hand, dog's owner provided vaccine records and patient is up-to-date on rabies vaccines. Patient alert, oriented, ambulatory, and in no apparent distress at this time. ?

## 2021-09-24 NOTE — ED Notes (Signed)
RN reviewed discharge instructions with pt. Pt verbalized understanding and had no further questions. VSS upon discharge.  

## 2021-10-01 ENCOUNTER — Ambulatory Visit: Payer: 59 | Admitting: Family Medicine

## 2021-10-01 VITALS — BP 122/74 | HR 90 | Temp 97.1°F | Ht 60.0 in | Wt 120.0 lb

## 2021-10-01 DIAGNOSIS — W540XXD Bitten by dog, subsequent encounter: Secondary | ICD-10-CM

## 2021-10-01 DIAGNOSIS — S61452A Open bite of left hand, initial encounter: Secondary | ICD-10-CM | POA: Diagnosis not present

## 2021-10-01 DIAGNOSIS — S61452D Open bite of left hand, subsequent encounter: Secondary | ICD-10-CM

## 2021-10-01 NOTE — Progress Notes (Signed)
?[facility] ?[facility] ?[place] ?[place] [address] ?Dept: [phone] ?Dept Fax: [phone] ? ?Office Visit ? ?Subjective:  ? ? Patient ID: Patricia Dunlap, female    DOB: 12-11-1965, 56 y.o..   MRN: [id number] ? ?Chief Complaint  ?Patient presents with  ? Follow-up  ?  Fu form hospital on 09/24/21 for dog bite on LT hand.  She is having some burning with water.    ? ? ?History of Present Illness: ? ?Patient is in today for reassessment of a dog bite to her left hand. [name] was out walking on 3/30 when one of her neighbor's dogs (a Boxer) came out, became aggressive and bit her hand. The dog's owner was able to forward documentation of the dogs up-to-date on rabies immunization  to the ED. Animal control was notified and has intervened with the Doctor, general practice. [name] suffered abrasions tot he 2nd and 3rd finger and a puncture wound to the 1st dorsal interosseous space. She has noted some ongoing bruising. She also has some decreased sensation in the 3rd and 4th fingers. She notes a stinging sensation when she has water on her palm. She received a tetanus immunization and was placed on Augmentin. ? ?Past Medical History: ?Patient Active Problem List  ? Diagnosis Date Noted  ? Menopause 06/05/2021  ? Vitamin D deficiency 06/05/2021  ? Prediabetes 06/05/2021  ? HLD (hyperlipidemia) 11/28/2017  ? Vaginal atrophy 11/28/2017  ? Acne 11/28/2017  ? Allergic rhinitis 09/16/2010  ? Heart murmur 09/16/2010  ? ?Past Surgical History:  ?Procedure Laterality Date  ? ABDOMINAL HYSTERECTOMY    ? BUNIONECTOMY    ? WISDOM TOOTH EXTRACTION    ? age 89  ? ?Family History  ?Problem Relation Age of Onset  ? Hyperlipidemia Mother   ? Hypertension Father   ? Other Father   ?     Supranuclear palsy  ? Colon cancer Neg Hx   ? ?Outpatient Medications Prior to Visit  ?Medication Sig Dispense Refill  ? atorvastatin (LIPITOR) 20 MG tablet Take 0.5 tablets (10 mg total) by mouth daily.  90 tablet 3  ? cetirizine (ZYRTEC) 10 MG tablet Take 1 tablet (10 mg total) by mouth daily. 30 tablet 0  ? guaiFENesin 200 MG tablet Take 1 tablet (200 mg total) by mouth every 4 (four) hours as needed for cough or to loosen phlegm. 30 suppository 0  ? meloxicam (MOBIC) 15 MG tablet Take 15 mg by mouth daily.    ? Multiple Vitamins-Minerals (MULTIVITAMIN WITH MINERALS) tablet Take 1 tablet by mouth daily.    ? naproxen (NAPROSYN) 500 MG tablet Take 1 tablet (500 mg total) by mouth 2 (two) times daily with a meal. 60 tablet 0  ? RESTASIS 0.05 % ophthalmic emulsion     ? spironolactone (ALDACTONE) 50 MG tablet Take 1 tablet (50 mg total) by mouth daily. 90 tablet 3  ? Vitamin D, Ergocalciferol, (DRISDOL) 1.25 MG (50000 UNIT) CAPS capsule Take 1 capsule (50,000 Units total) by mouth every 7 (seven) days. 5 capsule 2  ? ?No facility-administered medications prior to visit.  ? ?No Known Allergies ?   ?Objective:  ? ?Today's Vitals  ? 10/01/21 0956  ?BP: 122/74  ?Pulse: 90  ?Temp: (!) 97.1 ?F (36.2 ?C)  ?TempSrc: Temporal  ?SpO2: 97%  ?Weight: 120 lb (54.4 kg)  ?Height: 5' (1.524 m)  ? ?Body mass index is 23.44 kg/m?.  ? ?General: Well developed, well nourished. No acute distress. ?  Extremities: Full ROM of left hand, but painful. No significant swelling, but visible bruising across the  ? dorsum of the hand. Decreased sensation to the distal 3rd and 4th finger. Wounds on the 1st dorsal  ? interosseous space and 2nd and 3rd finger are healing well. ?Psych: Alert and oriented. Normal mood and affect. ? ?Health Maintenance Due  ?Topic Date Due  ? HIV Screening  Never done  ? Hepatitis C Screening  Never done  ?   ?Assessment & Plan:  ? ?1. Dog bite of hand without complication, left, subsequent encounter ?Continue routine wound care. I recommend she take 400 mg ibuprofen about 1/2 hour before doing 5 minutes of soaks in warm water with ROM exercises. Follow-up if sensation is not improving in the hand within 2  weeks. ? ?Return if symptoms worsen or fail to improve.  ? ?Haydee Salter, MD ?

## 2021-12-10 ENCOUNTER — Ambulatory Visit: Payer: 59 | Admitting: Family Medicine

## 2021-12-10 VITALS — BP 134/82 | HR 75 | Temp 97.1°F | Ht 60.0 in | Wt 116.8 lb

## 2021-12-10 DIAGNOSIS — L720 Epidermal cyst: Secondary | ICD-10-CM | POA: Diagnosis not present

## 2021-12-10 DIAGNOSIS — E559 Vitamin D deficiency, unspecified: Secondary | ICD-10-CM

## 2021-12-10 DIAGNOSIS — F4321 Adjustment disorder with depressed mood: Secondary | ICD-10-CM | POA: Diagnosis not present

## 2021-12-10 DIAGNOSIS — L708 Other acne: Secondary | ICD-10-CM

## 2021-12-10 DIAGNOSIS — E782 Mixed hyperlipidemia: Secondary | ICD-10-CM

## 2021-12-10 DIAGNOSIS — Z1211 Encounter for screening for malignant neoplasm of colon: Secondary | ICD-10-CM

## 2021-12-10 LAB — LIPID PANEL
Cholesterol: 230 mg/dL — ABNORMAL HIGH (ref 0–200)
HDL: 80.6 mg/dL (ref >39.00)
LDL Cholesterol: 141 mg/dL — ABNORMAL HIGH (ref 0–99)
NonHDL: 149.41
Total CHOL/HDL Ratio: 3
Triglycerides: 42 mg/dL (ref 0.0–149.0)
VLDL: 8.4 mg/dL (ref 0.0–40.0)

## 2021-12-10 LAB — VITAMIN D 25 HYDROXY (VIT D DEFICIENCY, FRACTURES): VITD: 24.98 ng/mL — ABNORMAL LOW (ref 30.00–100.00)

## 2021-12-10 NOTE — Progress Notes (Signed)
[facility] [address] [place] [address] Dept: [phone] Dept Fax: [phone]  Chronic Care Office Visit  Subjective:    Patient ID: Patricia Dunlap, female    DOB: Nov 02, 1965, 56 y.o..   MRN: [id number]  Chief Complaint  Patient presents with   Follow-up    6 month f/u. Fasting today.  C/o having dark spots on forehead and foot.     History of Present Illness:  Patient is in today for reassessment of chronic medical issues.  Patricia Dunlap has a history of acne. She is on spironolactone and feels this has improved her skin. She does have two lesions on her forehead and one on her foot that she isn't sure about.   Patricia Dunlap has a history of hyperlipidemia. She is managed on atorvastatin. We increased her atorvastatin at her last visit.  Patricia Dunlap has a history fo a Vitamin D deficiency. She completed a course of replacement Vitamin D.  Patricia Dunlap's husband, Patricia Dunlap, died recently related to a genetic neurodegenerative disease. She admits that she is feeling sad about his loss, but she feels she is managing wit this at the current time.   Past Medical History: Patient Active Problem List   Diagnosis Date Noted   Menopause 06/05/2021   Vitamin D deficiency 06/05/2021   Prediabetes 06/05/2021   Hyperlipidemia 11/28/2017   Vaginal atrophy 11/28/2017   Acne 11/28/2017   Allergic rhinitis 09/16/2010   Heart murmur 09/16/2010   Past Surgical History:  Procedure Laterality Date   ABDOMINAL HYSTERECTOMY     BUNIONECTOMY     WISDOM TOOTH EXTRACTION     age 58   Family History  Problem Relation Age of Onset   Hyperlipidemia Mother    Hypertension Father    Other Father        Supranuclear palsy   Colon cancer Neg Hx    Outpatient Medications Prior to Visit  Medication Sig Dispense Refill   atorvastatin (LIPITOR) 20 MG tablet Take 0.5 tablets (10 mg total) by mouth daily. 90 tablet 3   cetirizine (ZYRTEC)  10 MG tablet Take 1 tablet (10 mg total) by mouth daily. 30 tablet 0   meloxicam (MOBIC) 15 MG tablet Take 15 mg by mouth daily.     Multiple Vitamins-Minerals (MULTIVITAMIN WITH MINERALS) tablet Take 1 tablet by mouth daily.     naproxen (NAPROSYN) 500 MG tablet Take 1 tablet (500 mg total) by mouth 2 (two) times daily with a meal. 60 tablet 0   RESTASIS 0.05 % ophthalmic emulsion      spironolactone (ALDACTONE) 50 MG tablet Take 1 tablet (50 mg total) by mouth daily. 90 tablet 3   Vitamin D, Ergocalciferol, (DRISDOL) 1.25 MG (50000 UNIT) CAPS capsule Take 1 capsule (50,000 Units total) by mouth every 7 (seven) days. 5 capsule 2   guaiFENesin 200 MG tablet Take 1 tablet (200 mg total) by mouth every 4 (four) hours as needed for cough or to loosen phlegm. 30 suppository 0   No facility-administered medications prior to visit.   No Known Allergies    Objective:   Today's Vitals   12/10/21 0853  BP: 134/82  Pulse: 75  Temp: (!) 97.1 F (36.2 C)  TempSrc: Temporal  SpO2: 99%  Weight: 116 lb 12.8 oz (53 kg)  Height: 5' (1.524 m)   Body mass index is 22.81 kg/m.   General: Well developed, well nourished. No acute distress. Skin: Warm and dry. There are  two small pustules/papules on the forehead. There is a small papule ont he   dorsum of the left foot with an apparent whitish core. Psych: Alert and oriented. Sad mood and affect with tearfulness.  Health Maintenance Due  Topic Date Due   HIV Screening  Never done   Hepatitis C Screening  Never done   COVID-19 Vaccine (5 - Pfizer series) 02/16/2021   COLONOSCOPY (Pts 45-66yr Insurance coverage will need to be confirmed)  11/04/2021     Assessment & Plan:   1. Mixed hyperlipidemia Due for repeat lipids today. Continue atorvastatin 20 mg daily.  - Lipid panel  2. Vitamin D deficiency Due for repeat Vitamin D to assess current status.  - VITAMIN D 25 Hydroxy (Vit-D Deficiency, Fractures)  3. Grief Patricia Dunlap grieving  for the loss of her husband as would be expected. I offered assistance should she need anything.  4. Milia The lesion on the foot is consistent with a milia. I recommended she use a clean needle to prick the top and remove the core.  5. Other acne The lesions on the forehead area consistent with her acne. I recommend she use a topical benzoyl peroxide treatment.  6. Screening for colon cancer  - Ambulatory referral to Gastroenterology  Return in about 6 months (around 06/11/2022) for Reassessment.   Patricia Salter MD

## 2021-12-21 ENCOUNTER — Encounter: Payer: Self-pay | Admitting: Internal Medicine

## 2021-12-30 ENCOUNTER — Encounter: Payer: Self-pay | Admitting: Internal Medicine

## 2022-02-11 NOTE — Telephone Encounter (Signed)
Langley Gauss - can you sign off on encounter to close? I am not able to.

## 2022-02-15 NOTE — Telephone Encounter (Signed)
Encounter signed. Dm/cma

## 2022-02-15 NOTE — Telephone Encounter (Signed)
Which encounter?

## 2022-02-25 ENCOUNTER — Encounter: Payer: Self-pay | Admitting: Internal Medicine

## 2022-02-25 ENCOUNTER — Ambulatory Visit (AMBULATORY_SURGERY_CENTER): Payer: 59 | Admitting: *Deleted

## 2022-02-25 VITALS — Ht 60.0 in | Wt 115.0 lb

## 2022-02-25 DIAGNOSIS — Z86010 Personal history of colonic polyps: Secondary | ICD-10-CM

## 2022-02-25 MED ORDER — NA SULFATE-K SULFATE-MG SULF 17.5-3.13-1.6 GM/177ML PO SOLN: 1.0000 | ORAL | 0 refills | Status: DC

## 2022-02-25 NOTE — Progress Notes (Signed)
Patient is here in-person for PV. Patient denies any allergies to eggs or soy. Patient denies any problems with anesthesia/sedation. Patient is not on any oxygen at home. Patient is not taking any diet/weight loss medications or blood thinners. Went over procedure prep instructions with the patient. Patient is aware of our care-partner policy. Patient notified to use Singlecare or Good-Rx for prescription.   Patient denies constipation.

## 2022-03-07 ENCOUNTER — Encounter: Payer: Self-pay | Admitting: Certified Registered Nurse Anesthetist

## 2022-03-12 ENCOUNTER — Ambulatory Visit (AMBULATORY_SURGERY_CENTER): Payer: 59 | Admitting: Internal Medicine

## 2022-03-12 ENCOUNTER — Encounter: Payer: Self-pay | Admitting: Internal Medicine

## 2022-03-12 VITALS — BP 96/65 | HR 70 | Temp 96.8°F | Resp 20 | Ht 60.0 in | Wt 115.0 lb

## 2022-03-12 DIAGNOSIS — Z86010 Personal history of colonic polyps: Secondary | ICD-10-CM

## 2022-03-12 DIAGNOSIS — Z09 Encounter for follow-up examination after completed treatment for conditions other than malignant neoplasm: Secondary | ICD-10-CM | POA: Diagnosis present

## 2022-03-12 MED ORDER — SODIUM CHLORIDE 0.9 % IV SOLN: 500.0000 mL | Freq: Once | INTRAVENOUS | Status: DC

## 2022-03-12 NOTE — Progress Notes (Signed)
Vitals-DT  Pt's states no medical or surgical changes since previsit or office visit.  

## 2022-03-12 NOTE — Progress Notes (Signed)
Report given to PACU, vss 

## 2022-03-12 NOTE — Patient Instructions (Signed)
Please read handouts provided. Continue present medications. Repeat colonoscopy in 10 years for screening.   YOU HAD AN ENDOSCOPIC PROCEDURE TODAY AT THE [facility]:   Refer to the procedure report that was given to you for any specific questions about what was found during the examination.  If the procedure report does not answer your questions, please call your gastroenterologist to clarify.  If you requested that your care partner not be given the details of your procedure findings, then the procedure report has been included in a sealed envelope for you to review at your convenience later.  YOU SHOULD EXPECT: Some feelings of bloating in the abdomen. Passage of more gas than usual.  Walking can help get rid of the air that was put into your GI tract during the procedure and reduce the bloating. If you had a lower endoscopy (such as a colonoscopy or flexible sigmoidoscopy) you may notice spotting of blood in your stool or on the toilet paper. If you underwent a bowel prep for your procedure, you may not have a normal bowel movement for a few days.  Please Note:  You might notice some irritation and congestion in your nose or some drainage.  This is from the oxygen used during your procedure.  There is no need for concern and it should clear up in a day or so.  SYMPTOMS TO REPORT IMMEDIATELY:  Following lower endoscopy (colonoscopy or flexible sigmoidoscopy):  Excessive amounts of blood in the stool  Significant tenderness or worsening of abdominal pains  Swelling of the abdomen that is new, acute  Fever of 100F or higher.  For urgent or emergent issues, a gastroenterologist can be reached at any hour by calling [phone]. Do not use MyChart messaging for urgent concerns.    DIET:  We do recommend a small meal at first, but then you may proceed to your regular diet.  Drink plenty of fluids but you should avoid alcoholic beverages for 24 hours.  ACTIVITY:  You should  plan to take it easy for the rest of today and you should NOT DRIVE or use heavy machinery until tomorrow (because of the sedation medicines used during the test).    FOLLOW UP: Our staff will call the number listed on your records the next business day following your procedure.  We will call around 7:15- 8:00 am to check on you and address any questions or concerns that you may have regarding the information given to you following your procedure. If we do not reach you, we will leave a message.     If any biopsies were taken you will be contacted by phone or by letter within the next 1-3 weeks.  Please call us at [phone] if you have not heard about the biopsies in 3 weeks.    SIGNATURES/CONFIDENTIALITY: You and/or your care partner have signed paperwork which will be entered into your electronic medical record.  These signatures attest to the fact that that the information above on your After Visit Summary has been reviewed and is understood.  Full responsibility of the confidentiality of this discharge information lies with you and/or your care-partner. 

## 2022-03-12 NOTE — Op Note (Signed)
[facility] Patient Name: Patricia Dunlap Procedure Date: 03/12/2022 8:07 AM MRN: [id number] Endoscopist: Jerene Bears , MD Age: 56 Referring MD:  Date of Birth: 01/08/1966 Gender: Female Account #: [id number] Procedure:                Colonoscopy Indications:              High risk colon cancer surveillance: Personal                            history of non-advanced adenoma and SSP, Last                            colonoscopy: May 2018 Medicines:                Monitored Anesthesia Care Procedure:                Pre-Anesthesia Assessment:                           - Prior to the procedure, a History and Physical                            was performed, and patient medications and                            allergies were reviewed. The patient's tolerance of                            previous anesthesia was also reviewed. The risks                            and benefits of the procedure and the sedation                            options and risks were discussed with the patient.                            All questions were answered, and informed consent                            was obtained. Prior Anticoagulants: The patient has                            taken no previous anticoagulant or antiplatelet                            agents. ASA Grade Assessment: II - A patient with                            mild systemic disease. After reviewing the risks                            and benefits, the patient was deemed in  satisfactory condition to undergo the procedure.                           After obtaining informed consent, the colonoscope                            was passed under direct vision. Throughout the                            procedure, the patient's blood pressure, pulse, and                            oxygen saturations were monitored continuously. The                            Olympus PCF-H190DL (#[id number]) Colonoscope was                             introduced through the anus and advanced to the                            cecum, identified by appendiceal orifice and                            ileocecal valve. The colonoscopy was performed                            without difficulty. The patient tolerated the                            procedure well. The quality of the bowel                            preparation was excellent. The ileocecal valve,                            appendiceal orifice, and rectum were photographed. Scope In: 8:10:05 AM Scope Out: 8:24:33 AM Scope Withdrawal Time: 0 hours 10 minutes 28 seconds  Total Procedure Duration: 0 hours 14 minutes 28 seconds  Findings:                 The digital rectal exam was normal.                           The colon (entire examined portion) appeared normal.                           Internal hemorrhoids were found during                            retroflexion. The hemorrhoids were small. Complications:            No immediate complications. Estimated Blood Loss:     Estimated blood loss: none. Impression:               - The entire examined colon is  normal.                           - Small internal and external hemorrhoids.                           - No specimens collected. Recommendation:           - Patient has a contact number available for                            emergencies. The signs and symptoms of potential                            delayed complications were discussed with the                            patient. Return to normal activities tomorrow.                            Written discharge instructions were provided to the                            patient.                           - Resume previous diet.                           - Continue present medications.                           - Repeat colonoscopy in 10 years for surveillance. Jerene Bears, MD 03/12/2022 8:27:57 AM This report has been signed electronically.

## 2022-03-12 NOTE — Progress Notes (Signed)
GASTROENTEROLOGY PROCEDURE H&P NOTE   Primary Care Physician: Haydee Salter, MD    Reason for Procedure:  History of tubular adenoma and SSP  Plan:    Colonoscopy  Patient is appropriate for endoscopic procedure(s) in the ambulatory ([facility]) setting.  The nature of the procedure, as well as the risks, benefits, and alternatives were carefully and thoroughly reviewed with the patient. Ample time for discussion and questions allowed. The patient understood, was satisfied, and agreed to proceed.     HPI: Patricia Dunlap is a 56 y.o. female who presents for surveillance colonoscopy.  Medical history as below.  Tolerated the prep.  No recent chest pain or shortness of breath.  No abdominal pain today.  Past Medical History:  Diagnosis Date   Allergy    Anemia    resolved after hysterectomy   Heart murmur    Hyperlipidemia     Past Surgical History:  Procedure Laterality Date   ABDOMINAL HYSTERECTOMY     BUNIONECTOMY     COLONOSCOPY  11/04/2016   Dr.Mackinley Kiehn   POLYPECTOMY     WISDOM TOOTH EXTRACTION     age 79    Prior to Admission medications   Medication Sig Start Date End Date Taking? Authorizing Provider  atorvastatin (LIPITOR) 20 MG tablet Take 0.5 tablets (10 mg total) by mouth daily. 06/05/21  Yes Haydee Salter, MD  cetirizine (ZYRTEC) 10 MG tablet Take 1 tablet (10 mg total) by mouth daily. 02/22/21  Yes Mound, Michele Rockers, FNP  Multiple Vitamins-Minerals (MULTIVITAMIN WITH MINERALS) tablet Take 1 tablet by mouth daily.   Yes [provider]  Na Sulfate-K Sulfate-Mg Sulf 17.5-3.13-1.6 GM/177ML SOLN Take 1 kit by mouth as directed. May use generic SUPREP;NO prior authorizations will be done.Please use Singlecare or GOOD-RX coupon. 02/25/22  Yes Haden Cavenaugh, Lajuan Lines, MD  RESTASIS 0.05 % ophthalmic emulsion  01/26/20  Yes [provider]  spironolactone (ALDACTONE) 50 MG tablet Take 1 tablet (50 mg total) by mouth daily. 07/10/21  Yes Haydee Salter, MD   VITAMIN D PO Take by mouth daily.   Yes [provider]  meloxicam (MOBIC) 15 MG tablet Take 15 mg by mouth daily. 08/14/21   [provider]    Current Outpatient Medications  Medication Sig Dispense Refill   atorvastatin (LIPITOR) 20 MG tablet Take 0.5 tablets (10 mg total) by mouth daily. 90 tablet 3   cetirizine (ZYRTEC) 10 MG tablet Take 1 tablet (10 mg total) by mouth daily. 30 tablet 0   Multiple Vitamins-Minerals (MULTIVITAMIN WITH MINERALS) tablet Take 1 tablet by mouth daily.     Na Sulfate-K Sulfate-Mg Sulf 17.5-3.13-1.6 GM/177ML SOLN Take 1 kit by mouth as directed. May use generic SUPREP;NO prior authorizations will be done.Please use Singlecare or GOOD-RX coupon. 354 mL 0   RESTASIS 0.05 % ophthalmic emulsion      spironolactone (ALDACTONE) 50 MG tablet Take 1 tablet (50 mg total) by mouth daily. 90 tablet 3   VITAMIN D PO Take by mouth daily.     meloxicam (MOBIC) 15 MG tablet Take 15 mg by mouth daily.     Current Facility-Administered Medications  Medication Dose Route Frequency Provider Last Rate Last Admin   0.9 %  sodium chloride infusion  500 mL Intravenous Once Ameira Alessandrini, Lajuan Lines, MD        Allergies as of 03/12/2022   (No Known Allergies)    Family History  Problem Relation Age of Onset   Hyperlipidemia Mother  Hypertension Father    Other Father        Supranuclear palsy   Colon cancer Neg Hx    Colon polyps Neg Hx    Esophageal cancer Neg Hx    Stomach cancer Neg Hx    Rectal cancer Neg Hx     Social History   Socioeconomic History   Marital status: Married    Spouse name: Not on file   Number of children: 1   Years of education: Not on file   Highest education level: Bachelor's degree (e.g., BA, AB, BS)  Occupational History   Occupation: Leisure centre manager- Part-time    Comment: [employer]  Tobacco Use   Smoking status: Never   Smokeless tobacco: Never  Vaping Use   Vaping Use: Never used  Substance and Sexual  Activity   Alcohol use: Yes    Alcohol/week: 2.0 standard drinks of alcohol    Types: 2 Standard drinks or equivalent per week    Comment: socially   Drug use: No   Sexual activity: Yes  Other Topics Concern   Not on file  Social History Narrative   Not on file   Social Determinants of Health   Financial Resource Strain: Not on file  Food Insecurity: Not on file  Transportation Needs: Not on file  Physical Activity: Not on file  Stress: Not on file  Social Connections: Not on file  Intimate Partner Violence: Not on file    Physical Exam: Vital signs in last 24 hours: _0  124/73   Pulse 74   Temp (!) 96.8 F (36 C) (Temporal)   Resp 19   Ht 5' (1.524 m)   Wt 115 lb (52.2 kg)   SpO2 100%   BMI 22.46 kg/m  GEN: NAD EYE: Sclerae anicteric ENT: MMM CV: Non-tachycardic Pulm: CTA b/l GI: Soft, NT/ND NEURO:  Alert & Oriented x 3   Zenovia Jarred, MD [facility]  03/12/2022 8:05 AM

## 2022-03-15 ENCOUNTER — Telehealth: Payer: Self-pay

## 2022-03-15 NOTE — Telephone Encounter (Signed)
  Follow up Call-     03/12/2022    7:11 AM 03/12/2022    7:07 AM  Call back number  Post procedure Call Back phone  # [phone]   Permission to leave phone message  Yes     Patient questions:  Do you have a fever, pain , or abdominal swelling? No. Pain Score  0 *  Have you tolerated food without any problems? Yes.    Have you been able to return to your normal activities? Yes.    Do you have any questions about your discharge instructions: Diet   No. Medications  No. Follow up visit  No.  Do you have questions or concerns about your Care? No.  Actions: * If pain score is 4 or above: No action needed, pain <4.

## 2022-04-10 LAB — HM MAMMOGRAPHY

## 2022-04-13 ENCOUNTER — Encounter: Payer: Self-pay | Admitting: Family Medicine

## 2022-06-10 ENCOUNTER — Other Ambulatory Visit: Payer: Self-pay | Admitting: Family Medicine

## 2022-06-10 DIAGNOSIS — E785 Hyperlipidemia, unspecified: Secondary | ICD-10-CM

## 2022-06-11 ENCOUNTER — Ambulatory Visit: Payer: 59 | Admitting: Family Medicine

## 2022-06-25 ENCOUNTER — Telehealth: Payer: Self-pay | Admitting: Family Medicine

## 2022-06-25 ENCOUNTER — Ambulatory Visit: Payer: 59 | Admitting: Family Medicine

## 2022-06-25 NOTE — Telephone Encounter (Signed)
PT called in at 8am 12/29 to cancel OV with Dr. Gena Fray. This is her first, letter sent

## 2022-07-02 NOTE — Telephone Encounter (Signed)
same day cancel/exposed to covid, fee waived, letter sent

## 2022-07-08 ENCOUNTER — Other Ambulatory Visit: Payer: Self-pay | Admitting: Family Medicine

## 2022-07-08 DIAGNOSIS — L708 Other acne: Secondary | ICD-10-CM

## 2022-07-12 ENCOUNTER — Encounter: Payer: Self-pay | Admitting: Family Medicine

## 2022-07-12 ENCOUNTER — Ambulatory Visit: Payer: 59 | Admitting: Family Medicine

## 2022-07-12 VITALS — BP 118/70 | HR 72 | Temp 97.0°F | Ht 60.0 in | Wt 121.2 lb

## 2022-07-12 DIAGNOSIS — E785 Hyperlipidemia, unspecified: Secondary | ICD-10-CM | POA: Diagnosis not present

## 2022-07-12 DIAGNOSIS — G8929 Other chronic pain: Secondary | ICD-10-CM

## 2022-07-12 DIAGNOSIS — M545 Low back pain, unspecified: Secondary | ICD-10-CM | POA: Diagnosis not present

## 2022-07-12 MED ORDER — ATORVASTATIN CALCIUM 20 MG PO TABS: 20.0000 mg | ORAL_TABLET | Freq: Every day | ORAL | 3 refills | Status: DC

## 2022-07-12 MED ORDER — MELOXICAM 15 MG PO TABS: 15.0000 mg | ORAL_TABLET | Freq: Every day | ORAL | 3 refills | Status: DC

## 2022-07-12 NOTE — Progress Notes (Signed)
[facility] [address] [place] [address] Dept: [phone] Dept Fax: [phone]  Chronic Care Office Visit  Subjective:    Patient ID: Patricia Dunlap, female    DOB: [birth year], 57 y.o..   MRN: [id number]  Chief Complaint  Patient presents with   Follow-up    6 month f/u.  Fasting today.  No concerns.      History of Present Illness:  Patient is in today for reassessment of chronic medical issues.  Ms. Putnam has a history of hyperlipidemia. She is managed on atorvastatin 10 mg daily. We had intended to increase her dose last Winter to 20 mg daily, but a scribing error had kept her on 10 mg (1/2 of a 20 mg tab). She admits it is quite hard to cut the tablets in half.   Ms. Fluellen has a history of low back pain. This is in part due to injury she sustained when needing to lift her husband frequently. She is managed on meloxicam, which seems to help.  She is adjusting to life without her husband. She notes she ahs gone back to work full-time and appreciates the daily interaction with others.  Past Medical History: Patient Active Problem List   Diagnosis Date Noted   Menopause 06/05/2021   Vitamin D insufficiency 06/05/2021   Prediabetes 06/05/2021   Hyperlipidemia 11/28/2017   Vaginal atrophy 11/28/2017   Acne 11/28/2017   Allergic rhinitis 09/16/2010   Heart murmur 09/16/2010   Past Surgical History:  Procedure Laterality Date   ABDOMINAL HYSTERECTOMY     BUNIONECTOMY     COLONOSCOPY  11/04/2016   Dr.Pyrtle   POLYPECTOMY     WISDOM TOOTH EXTRACTION     age 57   Family History  Problem Relation Age of Onset   Hyperlipidemia Mother    Hypertension Father    Other Father        Supranuclear palsy   Colon cancer Neg Hx    Colon polyps Neg Hx    Esophageal cancer Neg Hx    Stomach cancer Neg Hx    Rectal cancer Neg Hx    Outpatient Medications Prior to Visit  Medication Sig Dispense Refill    cetirizine (ZYRTEC) 10 MG tablet Take 1 tablet (10 mg total) by mouth daily. 30 tablet 0   Multiple Vitamins-Minerals (MULTIVITAMIN WITH MINERALS) tablet Take 1 tablet by mouth daily.     RESTASIS 0.05 % ophthalmic emulsion      spironolactone (ALDACTONE) 50 MG tablet Take 1 tablet by mouth once daily 90 tablet 3   VITAMIN D PO Take by mouth daily.     atorvastatin (LIPITOR) 20 MG tablet Take 1/2 (one-half) tablet by mouth once daily 45 tablet 3   meloxicam (MOBIC) 15 MG tablet Take 15 mg by mouth daily.     Na Sulfate-K Sulfate-Mg Sulf 17.5-3.13-1.6 GM/177ML SOLN Take 1 kit by mouth as directed. May use generic SUPREP;NO prior authorizations will be done.Please use Singlecare or GOOD-RX coupon. 354 mL 0   No facility-administered medications prior to visit.   No Known Allergies    Objective:   Today's Vitals   07/12/22 0859  BP: 118/70  Pulse: 72  Temp: (!) 97 F (36.1 C)  TempSrc: Temporal  SpO2: 97%  Weight: 121 lb 3.2 oz (55 kg)  Height: 5' (1.524 m)   Body mass index is 23.67 kg/m.   General: Well developed, well nourished. No acute distress. Psych: Alert and oriented. Normal  mood and affect.  Health Maintenance Due  Topic Date Due   HIV Screening  Never done   Hepatitis C Screening  Never done     Assessment & Plan:   1. Hyperlipidemia, unspecified hyperlipidemia type I will correct the prescription and have her take 20 mg daily. I will plan to reassess her lipids in June.  - atorvastatin (LIPITOR) 20 MG tablet; Take 1 tablet (20 mg total) by mouth daily.  Dispense: 90 tablet; Refill: 3  2. Chronic bilateral low back pain without sciatica Stable. Continue meloxicam as needed.  - meloxicam (MOBIC) 15 MG tablet; Take 1 tablet (15 mg total) by mouth daily.  Dispense: 90 tablet; Refill: 3   Return in about 5 months (around 12/11/2022).   Haydee Salter, MD

## 2022-07-14 ENCOUNTER — Telehealth: Payer: Self-pay | Admitting: Family Medicine

## 2022-07-14 DIAGNOSIS — M545 Low back pain, unspecified: Secondary | ICD-10-CM

## 2022-07-14 DIAGNOSIS — E785 Hyperlipidemia, unspecified: Secondary | ICD-10-CM

## 2022-07-14 MED ORDER — MELOXICAM 15 MG PO TABS: 15.0000 mg | ORAL_TABLET | Freq: Every day | ORAL | 3 refills | Status: DC

## 2022-07-14 MED ORDER — ATORVASTATIN CALCIUM 20 MG PO TABS: 20.0000 mg | ORAL_TABLET | Freq: Every day | ORAL | 3 refills | Status: DC

## 2022-07-14 NOTE — Telephone Encounter (Signed)
Pt is wanting both her medications from 07/12/22 sent over to [facility]  Address: [address], [place], [place] [address]   Phone: [phone]

## 2022-07-14 NOTE — Telephone Encounter (Signed)
Patient notified Via phone. Dm/cma

## 2022-12-10 ENCOUNTER — Ambulatory Visit: Payer: 59 | Admitting: Family Medicine

## 2022-12-15 ENCOUNTER — Ambulatory Visit: Payer: 59 | Admitting: Family Medicine

## 2022-12-15 ENCOUNTER — Encounter: Payer: Self-pay | Admitting: Family Medicine

## 2022-12-15 VITALS — BP 122/74 | HR 76 | Temp 98.2°F | Wt 120.6 lb

## 2022-12-15 DIAGNOSIS — L708 Other acne: Secondary | ICD-10-CM | POA: Diagnosis not present

## 2022-12-15 DIAGNOSIS — Z1159 Encounter for screening for other viral diseases: Secondary | ICD-10-CM

## 2022-12-15 DIAGNOSIS — R7303 Prediabetes: Secondary | ICD-10-CM | POA: Diagnosis not present

## 2022-12-15 DIAGNOSIS — E782 Mixed hyperlipidemia: Secondary | ICD-10-CM | POA: Diagnosis not present

## 2022-12-15 DIAGNOSIS — E559 Vitamin D deficiency, unspecified: Secondary | ICD-10-CM | POA: Diagnosis not present

## 2022-12-15 NOTE — Assessment & Plan Note (Signed)
I will check annual A1c.

## 2022-12-15 NOTE — Addendum Note (Signed)
Addended by: Trudee Kuster on: 12/15/2022 03:08 PM   Modules accepted: Orders

## 2022-12-15 NOTE — Assessment & Plan Note (Signed)
We will repeat fasting lipids. Plan to continue atorvastatin 20 mg daily. Recommend she consider a CoQ-10 supplement.

## 2022-12-15 NOTE — Assessment & Plan Note (Signed)
I will check on Vit. D levles to make sure these are not progressively worsening.

## 2022-12-15 NOTE — Progress Notes (Signed)
[facility] [address] [place] [address] Dept: [phone] Dept Fax: [phone]  Chronic Care Office Visit  Subjective:    Patient ID: Patricia Dunlap, female    DOB: [birth year], 57 y.o..   MRN: [id number]  Chief Complaint  Patient presents with   Medical Management of Chronic Issues    Non fasting. Cramping in feet and lower legs    History of Present Illness:  Patient is in today for reassessment of chronic medical issues.  Patricia Dunlap has a history of hyperlipidemia. She is managed on atorvastatin 20 mg daily. There had been confusion over her dosing last Winter, but she is consistently taking 20 mg daily currently.  Patricia Dunlap notes that she is overall doing better. She has been working through her grief related to the death of her husband.   Past Medical History: Patient Active Problem List   Diagnosis Date Noted   Menopause 06/05/2021   Vitamin D insufficiency 06/05/2021   Prediabetes 06/05/2021   Hyperlipidemia 11/28/2017   Vaginal atrophy 11/28/2017   Acne 11/28/2017   Allergic rhinitis 09/16/2010   Heart murmur 09/16/2010   Past Surgical History:  Procedure Laterality Date   ABDOMINAL HYSTERECTOMY     BUNIONECTOMY     COLONOSCOPY  11/04/2016   Dr.Pyrtle   POLYPECTOMY     WISDOM TOOTH EXTRACTION     age 8   Family History  Problem Relation Age of Onset   Hyperlipidemia Mother    Hypertension Father    Other Father        Supranuclear palsy   Colon cancer Neg Hx    Colon polyps Neg Hx    Esophageal cancer Neg Hx    Stomach cancer Neg Hx    Rectal cancer Neg Hx    Outpatient Medications Prior to Visit  Medication Sig Dispense Refill   atorvastatin (LIPITOR) 20 MG tablet Take 1 tablet (20 mg total) by mouth daily. 90 tablet 3   cetirizine (ZYRTEC) 10 MG tablet Take 1 tablet (10 mg total) by mouth daily. 30 tablet 0   meloxicam (MOBIC) 15 MG tablet Take 1 tablet (15 mg total) by mouth  daily. 90 tablet 3   Multiple Vitamins-Minerals (MULTIVITAMIN WITH MINERALS) tablet Take 1 tablet by mouth daily.     RESTASIS 0.05 % ophthalmic emulsion      spironolactone (ALDACTONE) 50 MG tablet Take 1 tablet by mouth once daily 90 tablet 3   VITAMIN D PO Take by mouth daily.     No facility-administered medications prior to visit.   No Known Allergies Objective:   Today's Vitals   12/15/22 1258  BP: 122/74  Pulse: 76  Temp: 98.2 F (36.8 C)  TempSrc: Temporal  SpO2: 99%  Weight: 120 lb 9.6 oz (54.7 kg)   Body mass index is 23.55 kg/m.   General: Well developed, well nourished. No acute distress. Psych: Alert and oriented. Normal mood and affect.  Health Maintenance Due  Topic Date Due   HIV Screening  Never done   Hepatitis C Screening  Never done     Assessment & Plan:   Problem List Items Addressed This Visit       Musculoskeletal and Integument   Acne    Stable. I will continue spironolactone 50 mg daily. I will check potassium level.      Relevant Orders   Basic metabolic panel     Other   Hyperlipidemia - Primary    We  will repeat fasting lipids. Plan to continue atorvastatin 20 mg daily. Recommend she consider a CoQ-10 supplement.      Relevant Orders   Lipid panel   Vitamin D insufficiency    I will check on Vit. D levles to make sure these are not progressively worsening.      Relevant Orders   VITAMIN D 25 Hydroxy (Vit-D Deficiency, Fractures)   Prediabetes    I will check annual A1c.      Relevant Orders   Basic metabolic panel   Hemoglobin A1c   Other Visit Diagnoses     Encounter for hepatitis C screening test for low risk patient       Relevant Orders   HCV Ab w Reflex to Quant PCR       Return in about 1 year (around 12/15/2023) for Reassessment.   Loyola Mast, MD

## 2022-12-15 NOTE — Assessment & Plan Note (Signed)
Stable. I will continue spironolactone 50 mg daily. I will check potassium level.

## 2023-01-14 ENCOUNTER — Other Ambulatory Visit: Payer: 59

## 2023-01-28 ENCOUNTER — Other Ambulatory Visit (INDEPENDENT_AMBULATORY_CARE_PROVIDER_SITE_OTHER): Payer: 59

## 2023-01-28 DIAGNOSIS — L708 Other acne: Secondary | ICD-10-CM

## 2023-01-28 DIAGNOSIS — E785 Hyperlipidemia, unspecified: Secondary | ICD-10-CM

## 2023-01-28 DIAGNOSIS — R7303 Prediabetes: Secondary | ICD-10-CM | POA: Diagnosis not present

## 2023-01-28 DIAGNOSIS — E559 Vitamin D deficiency, unspecified: Secondary | ICD-10-CM | POA: Diagnosis not present

## 2023-01-28 DIAGNOSIS — E782 Mixed hyperlipidemia: Secondary | ICD-10-CM

## 2023-01-28 DIAGNOSIS — Z1159 Encounter for screening for other viral diseases: Secondary | ICD-10-CM

## 2023-01-28 LAB — LIPID PANEL
Cholesterol: 212 mg/dL — ABNORMAL HIGH (ref 0–200)
HDL: 87.9 mg/dL (ref >39.00)
LDL Cholesterol: 116 mg/dL — ABNORMAL HIGH (ref 0–99)
NonHDL: 123.67
Total CHOL/HDL Ratio: 2
Triglycerides: 37 mg/dL (ref 0.0–149.0)
VLDL: 7.4 mg/dL (ref 0.0–40.0)

## 2023-01-28 LAB — BASIC METABOLIC PANEL
BUN: 26 mg/dL — ABNORMAL HIGH (ref 6–23)
CO2: 27 mEq/L (ref 19–32)
Calcium: 10 mg/dL (ref 8.4–10.5)
Chloride: 104 mEq/L (ref 96–112)
Creatinine, Ser: 1.05 mg/dL (ref 0.40–1.20)
GFR: 59.21 mL/min — ABNORMAL LOW (ref >60.00)
Glucose, Bld: 90 mg/dL (ref 70–99)
Potassium: 4.7 mEq/L (ref 3.5–5.1)
Sodium: 139 mEq/L (ref 135–145)

## 2023-01-28 LAB — VITAMIN D 25 HYDROXY (VIT D DEFICIENCY, FRACTURES): VITD: 29.11 ng/mL — ABNORMAL LOW (ref 30.00–100.00)

## 2023-01-28 LAB — HEMOGLOBIN A1C: Hgb A1c MFr Bld: 6 % (ref 4.6–6.5)

## 2023-01-30 MED ORDER — ATORVASTATIN CALCIUM 40 MG PO TABS
40.0000 mg | ORAL_TABLET | Freq: Every day | ORAL | 3 refills | Status: DC
Start: 2023-01-30 — End: 2023-12-23

## 2023-01-30 NOTE — Addendum Note (Signed)
Addended by: Loyola Mast on: 01/30/2023 07:20 PM   Modules accepted: Orders

## 2023-02-04 ENCOUNTER — Other Ambulatory Visit: Payer: 59

## 2023-03-10 ENCOUNTER — Ambulatory Visit
Admission: RE | Admit: 2023-03-10 | Discharge: 2023-03-10 | Disposition: A | Discharge status: Home / Self Care | Payer: 59 | Source: Ambulatory Visit | Attending: Internal Medicine | Admitting: Internal Medicine

## 2023-03-10 VITALS — BP 139/89 | HR 85 | Temp 98.8°F | Resp 16

## 2023-03-10 DIAGNOSIS — R051 Acute cough: Secondary | ICD-10-CM | POA: Insufficient documentation

## 2023-03-10 DIAGNOSIS — J069 Acute upper respiratory infection, unspecified: Secondary | ICD-10-CM | POA: Diagnosis present

## 2023-03-10 DIAGNOSIS — Z20822 Contact with and (suspected) exposure to covid-19: Secondary | ICD-10-CM | POA: Diagnosis present

## 2023-03-10 MED ORDER — BENZONATATE 100 MG PO CAPS: 100.0000 mg | ORAL_CAPSULE | Freq: Three times a day (TID) | ORAL | 0 refills | Status: DC | PRN

## 2023-03-10 MED ORDER — PREDNISONE 20 MG PO TABS
40.0000 mg | ORAL_TABLET | Freq: Every day | ORAL | 0 refills | Status: AC
Start: 2023-03-10 — End: 2023-03-15

## 2023-03-10 NOTE — ED Triage Notes (Signed)
Cough, congestion, sinus pain and pressure, itchy nose that started 5 days ago. Taking robitussin, OTC cold and flu medication with no relief.

## 2023-03-10 NOTE — Discharge Instructions (Signed)
I have prescribed you prednisone and a cough medication to alleviate your symptoms.  If you take meloxicam, please try to avoid meloxicam while taking prednisone as it can cause stomach upset.  Follow-up if any symptoms persist or worsen.

## 2023-03-10 NOTE — ED Provider Notes (Signed)
[name]    CSN: [id number] Arrival date & time: 03/10/23  1802      History   Chief Complaint Chief Complaint  Patient presents with   Nasal Congestion    possible sinus infection.    lots of coughing and sore throat - Entered by patient   Facial Pain    HPI Patricia Dunlap is a 57 y.o. female.   Patient presents with approximately 5-day history of nasal congestion, sinus pressure and pain, coughing.  Denies any fever.  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  Has taken over-the-counter medication with minimal improvement in symptoms.  Denies history of asthma or COPD and patient does not smoke cigarettes.     Past Medical History:  Diagnosis Date   Allergy    Anemia    resolved after hysterectomy   Heart murmur    Hyperlipidemia     Patient Active Problem List   Diagnosis Date Noted   Menopause 06/05/2021   Vitamin D insufficiency 06/05/2021   Prediabetes 06/05/2021   Hyperlipidemia 11/28/2017   Vaginal atrophy 11/28/2017   Acne 11/28/2017   Allergic rhinitis 09/16/2010   Heart murmur 09/16/2010    Past Surgical History:  Procedure Laterality Date   ABDOMINAL HYSTERECTOMY     BUNIONECTOMY     COLONOSCOPY  11/04/2016   Dr.Pyrtle   POLYPECTOMY     WISDOM TOOTH EXTRACTION     age 42    OB History   No obstetric history on file.      Home Medications    Prior to Admission medications   Medication Sig Start Date End Date Taking? Authorizing Provider  atorvastatin (LIPITOR) 40 MG tablet Take 1 tablet (40 mg total) by mouth daily. 01/30/23  Yes Loyola Mast, MD  benzonatate (TESSALON) 100 MG capsule Take 1 capsule (100 mg total) by mouth every 8 (eight) hours as needed for cough. 03/10/23  Yes Nishtha Raider, Acie Fredrickson, FNP  cetirizine (ZYRTEC) 10 MG tablet Take 1 tablet (10 mg total) by mouth daily. 02/22/21  Yes Emre Stock, Rolly Salter E, FNP  meloxicam (MOBIC) 15 MG tablet Take 1 tablet (15 mg total) by mouth  daily. 07/14/22  Yes Loyola Mast, MD  Multiple Vitamins-Minerals (MULTIVITAMIN WITH MINERALS) tablet Take 1 tablet by mouth daily.   Yes [provider]  predniSONE (DELTASONE) 20 MG tablet Take 2 tablets (40 mg total) by mouth daily for 5 days. 03/10/23 03/15/23 Yes Gustavus Bryant, FNP  RESTASIS 0.05 % ophthalmic emulsion  01/26/20  Yes [provider]  spironolactone (ALDACTONE) 50 MG tablet Take 1 tablet by mouth once daily 07/08/22  Yes Loyola Mast, MD  VITAMIN D PO Take by mouth daily.   Yes [provider]    Family History Family History  Problem Relation Age of Onset   Hyperlipidemia Mother    Hypertension Father    Other Father        Supranuclear palsy   Colon cancer Neg Hx    Colon polyps Neg Hx    Esophageal cancer Neg Hx    Stomach cancer Neg Hx    Rectal cancer Neg Hx     Social History Social History   Tobacco Use   Smoking status: Never   Smokeless tobacco: Never  Vaping Use   Vaping status: Never Used  Substance Use Topics   Alcohol use: Yes    Alcohol/week: 2.0 standard drinks of alcohol  Types: 2 Standard drinks or equivalent per week    Comment: socially   Drug use: No     Allergies   Patient has no known allergies.   Review of Systems Review of Systems Per HPI  Physical Exam Triage Vital Signs ED Triage Vitals  Encounter Vitals Group     BP 03/10/23 1817 139/89     Systolic BP Percentile --      Diastolic BP Percentile --      Pulse Rate 03/10/23 1815 85     Resp 03/10/23 1815 16     Temp 03/10/23 1815 98.8 F (37.1 C)     Temp Source 03/10/23 1815 Oral     SpO2 03/10/23 1815 98 %     Weight --      Height --      Head Circumference --      Peak Flow --      Pain Score 03/10/23 1816 0     Pain Loc --      Pain Education --      Exclude from Growth Chart --    No data found.  Updated Vital Signs BP 139/89   Pulse 85   Temp 98.8 F (37.1 C) (Oral)   Resp 16   SpO2 98%   Visual  Acuity Right Eye Distance:   Left Eye Distance:   Bilateral Distance:    Right Eye Near:   Left Eye Near:    Bilateral Near:     Physical Exam Constitutional:      General: She is not in acute distress.    Appearance: Normal appearance. She is not toxic-appearing or diaphoretic.  HENT:     Head: Normocephalic and atraumatic.     Right Ear: Ear canal normal. A middle ear effusion is present. Tympanic membrane is not perforated, erythematous or bulging.     Left Ear: Ear canal normal. A middle ear effusion is present. Tympanic membrane is not perforated, erythematous or bulging.     Nose: Congestion present.     Mouth/Throat:     Mouth: Mucous membranes are moist.     Pharynx: No posterior oropharyngeal erythema.  Eyes:     Extraocular Movements: Extraocular movements intact.     Conjunctiva/sclera: Conjunctivae normal.     Pupils: Pupils are equal, round, and reactive to light.  Cardiovascular:     Rate and Rhythm: Normal rate and regular rhythm.     Pulses: Normal pulses.     Heart sounds: Normal heart sounds.  Pulmonary:     Effort: Pulmonary effort is normal. No respiratory distress.     Breath sounds: Normal breath sounds. No stridor. No wheezing, rhonchi or rales.  Abdominal:     General: Abdomen is flat. Bowel sounds are normal.     Palpations: Abdomen is soft.  Musculoskeletal:        General: Normal range of motion.     Cervical back: Normal range of motion.  Skin:    General: Skin is warm and dry.  Neurological:     General: No focal deficit present.     Mental Status: She is alert and oriented to person, place, and time. Mental status is at baseline.  Psychiatric:        Mood and Affect: Mood normal.        Behavior: Behavior normal.      UC Treatments / Results  Labs (all labs ordered are listed, but only abnormal results are displayed) Labs Reviewed  SARS CORONAVIRUS 2 (TAT 6-24 HRS)    EKG   Radiology No results found.  Procedures Procedures  (including critical care time)  Medications Ordered in UC Medications - No data to display  Initial Impression / Assessment and Plan / UC Course  I have reviewed the triage vital signs and the nursing notes.  Pertinent labs & imaging results that were available during my care of the patient were reviewed by me and considered in my medical decision making (see chart for details).     Differential diagnoses include viral upper respiratory infection versus allergic rhinitis.  I have no concern for sinus infection or secondary bacterial infection on exam.  Will treat with prednisone steroid to alleviate symptoms and sinus pressure.  Benzonatate prescribed to take as needed for cough.  COVID test pending.  Advised supportive care and follow-up if any symptoms persist or worsen.  Patient verbalized understanding and was agreeable with plan. Final Clinical Impressions(s) / UC Diagnoses   Final diagnoses:  Acute upper respiratory infection  Acute cough  Encounter for laboratory testing for COVID-19 virus     Discharge Instructions      I have prescribed you prednisone and a cough medication to alleviate your symptoms.  If you take meloxicam, please try to avoid meloxicam while taking prednisone as it can cause stomach upset.  Follow-up if any symptoms persist or worsen.    ED Prescriptions     Medication Sig Dispense Auth. Provider   predniSONE (DELTASONE) 20 MG tablet Take 2 tablets (40 mg total) by mouth daily for 5 days. 10 tablet [place], [place], [place]   benzonatate (TESSALON) 100 MG capsule Take 1 capsule (100 mg total) by mouth every 8 (eight) hours as needed for cough. 21 capsule [place], Acie Fredrickson, [place]      PDMP not reviewed this encounter.   Gustavus Bryant, [place] 03/10/23 [phone]

## 2023-03-11 LAB — SARS CORONAVIRUS 2 (TAT 6-24 HRS): SARS Coronavirus 2: POSITIVE — AB

## 2023-04-30 LAB — HM MAMMOGRAPHY

## 2023-05-04 ENCOUNTER — Encounter: Payer: Self-pay | Admitting: Family Medicine

## 2023-07-02 ENCOUNTER — Ambulatory Visit: Payer: Self-pay

## 2023-07-09 ENCOUNTER — Other Ambulatory Visit: Payer: Self-pay | Admitting: Family Medicine

## 2023-07-09 DIAGNOSIS — L708 Other acne: Secondary | ICD-10-CM

## 2023-07-09 DIAGNOSIS — G8929 Other chronic pain: Secondary | ICD-10-CM

## 2023-07-12 ENCOUNTER — Encounter (HOSPITAL_BASED_OUTPATIENT_CLINIC_OR_DEPARTMENT_OTHER): Payer: Self-pay | Admitting: Student

## 2023-07-12 ENCOUNTER — Ambulatory Visit (HOSPITAL_BASED_OUTPATIENT_CLINIC_OR_DEPARTMENT_OTHER): Payer: 59

## 2023-07-12 ENCOUNTER — Ambulatory Visit (HOSPITAL_BASED_OUTPATIENT_CLINIC_OR_DEPARTMENT_OTHER): Payer: 59 | Admitting: Student

## 2023-07-12 DIAGNOSIS — S82892A Other fracture of left lower leg, initial encounter for closed fracture: Secondary | ICD-10-CM

## 2023-07-12 DIAGNOSIS — M25572 Pain in left ankle and joints of left foot: Secondary | ICD-10-CM | POA: Diagnosis not present

## 2023-07-12 NOTE — Progress Notes (Addendum)
 Chief Complaint: Left ankle injury     History of Present Illness:    Patricia Dunlap is a 58 y.o. female presenting today for evaluation of a left ankle injury.  She states that she sustained 2 falls this morning after getting out of the shower which she believes was due to passing out.  The cause of these syncopal episodes is unknown.  She states that after getting up from the second episode she noticed significant pain in her left ankle.  This was immediately difficult to weight-bear on.  Rates current pain at an 8/10 and describes this as sharp.  Does have some numbness extending down into the foot.  No previous history of ankle injuries.  Denies any recent medication changes other than an azithromycin she has been taking for bronchitis.   Surgical History:   None  PMH/PSH/Family History/Social History/Meds/Allergies:    Past Medical History:  Diagnosis Date   Allergy    Anemia    resolved after hysterectomy   Heart murmur    Hyperlipidemia    Past Surgical History:  Procedure Laterality Date   ABDOMINAL HYSTERECTOMY     BUNIONECTOMY     COLONOSCOPY  11/04/2016   Dr.Pyrtle   POLYPECTOMY     WISDOM TOOTH EXTRACTION     age 52   Social History   Socioeconomic History   Marital status: Widowed    Spouse name: Not on file   Number of children: 1   Years of education: Not on file   Highest education level: Bachelor's degree (e.g., BA, AB, BS)  Occupational History   Occupation: Designer, Industrial/product support    Comment: [employer]  Tobacco Use   Smoking status: Never   Smokeless tobacco: Never  Vaping Use   Vaping status: Never Used  Substance and Sexual Activity   Alcohol use: Yes    Alcohol/week: 2.0 standard drinks of alcohol    Types: 2 Standard drinks or equivalent per week    Comment: socially   Drug use: No   Sexual activity: Yes  Other Topics Concern   Not on file  Social History Narrative   Not on file    Social Drivers of Health   Financial Resource Strain: Not on file  Food Insecurity: Not on file  Transportation Needs: Not on file  Physical Activity: Not on file  Stress: Not on file  Social Connections: Not on file   Family History  Problem Relation Age of Onset   Hyperlipidemia Mother    Hypertension Father    Other Father        Supranuclear palsy   Colon cancer Neg Hx    Colon polyps Neg Hx    Esophageal cancer Neg Hx    Stomach cancer Neg Hx    Rectal cancer Neg Hx    No Known Allergies Current Outpatient Medications  Medication Sig Dispense Refill   atorvastatin  (LIPITOR) 40 MG tablet Take 1 tablet (40 mg total) by mouth daily. 90 tablet 3   benzonatate  (TESSALON ) 100 MG capsule Take 1 capsule (100 mg total) by mouth every 8 (eight) hours as needed for cough. 21 capsule 0   cetirizine  (ZYRTEC ) 10 MG tablet Take 1 tablet (10 mg total) by mouth daily. 30 tablet 0   meloxicam  (MOBIC ) 15 MG tablet Take 1 tablet  by mouth once daily 90 tablet 0   Multiple Vitamins-Minerals (MULTIVITAMIN WITH MINERALS) tablet Take 1 tablet by mouth daily.     RESTASIS 0.05 % ophthalmic emulsion      spironolactone  (ALDACTONE ) 50 MG tablet Take 1 tablet by mouth once daily 90 tablet 3   VITAMIN D  PO Take by mouth daily.     No current facility-administered medications for this visit.   No results found.  Review of Systems:   A ROS was performed including pertinent positives and negatives as documented in the HPI.  Physical Exam :   Constitutional: NAD and appears stated age Neurological: Alert and oriented Psych: Appropriate affect and cooperative There were no vitals taken for this visit.   Comprehensive Musculoskeletal Exam:    Left ankle exam demonstrates mild to moderate swelling without presence of obvious deformity.  Tenderness with palpation over the medial and lateral malleolus as well as pinpoint tenderness posterior to the medial malleolus.  Able to perform active ankle  dorsiflexion plantarflexion however motion limited due to pain and swelling.  Negative anterior drawer.  DP and PT pulses 2+.  Distal neurosensory exam intact.  Imaging:   Xray (left ankle 3 views): Small lucency in the posterior tibia suggesting a nondisplaced posterior medial malleolus fracture.  Nondisplaced distal fibula fracture seen best on lateral view.   I personally reviewed and interpreted the radiographs.   Assessment:   58 y.o. female with left ankle pain after an injury sustained during a likely syncopal episode.  X-rays do appear to show a small nondisplaced fracture of the posterior medial malleolus which does correlate to pinpoint pain on exam.  Also appears to be a nondisplaced distal fibula fracture.  Discussed treatment plan with immobilization in a walking boot with weight bearing as tolerated.  She did tolerate this well with gentle weight bearing in clinic.  Will have her continue in this and return in 4 weeks to repeat x-rays and reassess.  Also have recommended follow-up evaluation with PCP to determine etiology of syncopal episodes.  Plan :    - Placed in walking boot and return to clinic in 4 weeks     I personally saw and evaluated the patient, and participated in the management and treatment plan.  Leonce Reveal, PA-C Orthopedics

## 2023-07-13 ENCOUNTER — Emergency Department (HOSPITAL_BASED_OUTPATIENT_CLINIC_OR_DEPARTMENT_OTHER): Payer: 59

## 2023-07-13 ENCOUNTER — Ambulatory Visit: Payer: Self-pay | Admitting: Family Medicine

## 2023-07-13 ENCOUNTER — Encounter (HOSPITAL_BASED_OUTPATIENT_CLINIC_OR_DEPARTMENT_OTHER): Payer: Self-pay | Admitting: *Deleted

## 2023-07-13 ENCOUNTER — Other Ambulatory Visit: Payer: Self-pay

## 2023-07-13 ENCOUNTER — Emergency Department (HOSPITAL_BASED_OUTPATIENT_CLINIC_OR_DEPARTMENT_OTHER): Payer: 59 | Admitting: Radiology

## 2023-07-13 ENCOUNTER — Emergency Department (HOSPITAL_BASED_OUTPATIENT_CLINIC_OR_DEPARTMENT_OTHER)
Admission: EM | Admit: 2023-07-13 | Discharge: 2023-07-13 | Disposition: A | Discharge status: Home / Self Care | Payer: 59 | Attending: Emergency Medicine | Admitting: Emergency Medicine

## 2023-07-13 DIAGNOSIS — K2970 Gastritis, unspecified, without bleeding: Secondary | ICD-10-CM | POA: Diagnosis not present

## 2023-07-13 DIAGNOSIS — R55 Syncope and collapse: Secondary | ICD-10-CM | POA: Insufficient documentation

## 2023-07-13 DIAGNOSIS — Z20822 Contact with and (suspected) exposure to covid-19: Secondary | ICD-10-CM | POA: Diagnosis not present

## 2023-07-13 DIAGNOSIS — E860 Dehydration: Secondary | ICD-10-CM | POA: Insufficient documentation

## 2023-07-13 DIAGNOSIS — Y92002 Bathroom of unspecified non-institutional (private) residence single-family (private) house as the place of occurrence of the external cause: Secondary | ICD-10-CM | POA: Insufficient documentation

## 2023-07-13 DIAGNOSIS — X58XXXA Exposure to other specified factors, initial encounter: Secondary | ICD-10-CM | POA: Diagnosis not present

## 2023-07-13 DIAGNOSIS — S0990XA Unspecified injury of head, initial encounter: Secondary | ICD-10-CM | POA: Insufficient documentation

## 2023-07-13 LAB — RESP PANEL BY RT-PCR (RSV, FLU A&B, COVID)  RVPGX2
Influenza A by PCR: NEGATIVE
Influenza B by PCR: NEGATIVE
Resp Syncytial Virus by PCR: NEGATIVE
SARS Coronavirus 2 by RT PCR: NEGATIVE

## 2023-07-13 LAB — BASIC METABOLIC PANEL
Anion gap: 25 — ABNORMAL HIGH (ref 5–15)
BUN: 26 mg/dL — ABNORMAL HIGH (ref 6–20)
CO2: 24 mmol/L (ref 22–32)
Calcium: 10.8 mg/dL — ABNORMAL HIGH (ref 8.9–10.3)
Chloride: 95 mmol/L — ABNORMAL LOW (ref 98–111)
Creatinine, Ser: 1 mg/dL (ref 0.44–1.00)
GFR, Estimated: >60 mL/min (ref >60)
Glucose, Bld: 137 mg/dL — ABNORMAL HIGH (ref 70–99)
Potassium: 4.8 mmol/L (ref 3.5–5.1)
Sodium: 144 mmol/L (ref 135–145)

## 2023-07-13 LAB — CBC
HCT: 32.3 % — ABNORMAL LOW (ref 36.0–46.0)
Hemoglobin: 10.8 g/dL — ABNORMAL LOW (ref 12.0–15.0)
MCH: 29.4 pg (ref 26.0–34.0)
MCHC: 33.4 g/dL (ref 30.0–36.0)
MCV: 88 fL (ref 80.0–100.0)
Platelets: 394 10*3/uL (ref 150–400)
RBC: 3.67 MIL/uL — ABNORMAL LOW (ref 3.87–5.11)
RDW: 12.8 % (ref 11.5–15.5)
WBC: 16.2 10*3/uL — ABNORMAL HIGH (ref 4.0–10.5)
nRBC: 0 % (ref 0.0–0.2)

## 2023-07-13 LAB — CBG MONITORING, ED: Glucose-Capillary: 111 mg/dL — ABNORMAL HIGH (ref 70–99)

## 2023-07-13 LAB — TROPONIN I (HIGH SENSITIVITY)
Troponin I (High Sensitivity): 3 ng/L (ref <18)
Troponin I (High Sensitivity): 4 ng/L (ref <18)

## 2023-07-13 MED ORDER — SODIUM CHLORIDE 0.9 % IV BOLUS
1000.0000 mL | Freq: Once | INTRAVENOUS | Status: AC
  Administered 2023-07-13: 1000 mL via INTRAVENOUS

## 2023-07-13 MED ORDER — ONDANSETRON HCL 4 MG/2ML IJ SOLN
4.0000 mg | Freq: Once | INTRAMUSCULAR | Status: AC
Start: 2023-07-13 — End: 2023-07-13
  Administered 2023-07-13: 4 mg via INTRAVENOUS
  Filled 2023-07-13: qty 2

## 2023-07-13 MED ORDER — SUCRALFATE 1 G PO TABS: 1.0000 g | ORAL_TABLET | Freq: Three times a day (TID) | ORAL | 2 refills | Status: DC

## 2023-07-13 MED ORDER — SUCRALFATE 1 G PO TABS
1.0000 g | ORAL_TABLET | Freq: Three times a day (TID) | ORAL | 2 refills | Status: DC
  Filled 2023-07-13: qty 30, 8d supply, fill #0

## 2023-07-13 MED ORDER — IOHEXOL 350 MG/ML SOLN
100.0000 mL | Freq: Once | INTRAVENOUS | Status: AC | PRN
  Administered 2023-07-13: 85 mL via INTRAVENOUS

## 2023-07-13 MED ORDER — FAMOTIDINE 20 MG PO TABS: 20.0000 mg | ORAL_TABLET | Freq: Two times a day (BID) | ORAL | 0 refills | Status: DC

## 2023-07-13 MED ORDER — FAMOTIDINE IN NACL 20-0.9 MG/50ML-% IV SOLN
20.0000 mg | Freq: Once | INTRAVENOUS | Status: AC
  Administered 2023-07-13: 20 mg via INTRAVENOUS
  Filled 2023-07-13: qty 50

## 2023-07-13 MED ORDER — ONDANSETRON 4 MG PO TBDP
4.0000 mg | ORAL_TABLET | Freq: Three times a day (TID) | ORAL | 0 refills | Status: DC | PRN
  Filled 2023-07-13: qty 12, 4d supply, fill #0

## 2023-07-13 MED ORDER — ONDANSETRON 4 MG PO TBDP: 4.0000 mg | ORAL_TABLET | Freq: Three times a day (TID) | ORAL | 0 refills | Status: DC | PRN

## 2023-07-13 MED ORDER — HYDROMORPHONE HCL 1 MG/ML IJ SOLN
0.5000 mg | Freq: Once | INTRAMUSCULAR | Status: AC
  Administered 2023-07-13: 0.5 mg via INTRAVENOUS
  Filled 2023-07-13: qty 1

## 2023-07-13 MED ORDER — FAMOTIDINE 20 MG PO TABS
20.0000 mg | ORAL_TABLET | Freq: Two times a day (BID) | ORAL | 0 refills | Status: DC
  Filled 2023-07-13: qty 30, 15d supply, fill #0

## 2023-07-13 NOTE — ED Triage Notes (Signed)
 Pt has fainted several times since yesterday.  She fainted twice yesterday, once she injured her  left foot and was seen at ortho for this injury.  Pt fainted again today and she is having suprapubic pain.  Pt is tearful in triage. Pt has had URI

## 2023-07-13 NOTE — ED Provider Notes (Signed)
[facility] AT [facility] Provider Note   CSN: [id number] Arrival date & time: 07/13/23  1305     History  Chief Complaint  Patient presents with   Loss of Consciousness    Patricia Dunlap is a 58 y.o. female.  58 year old female with a history of anemia and hyperlipidemia who presents emergency department with a syncopal episode.  Patient reports that over the past 2 weeks has been getting over bronchitis.  Says that she still feels crummy.  Still has a cough.  Also was having left-sided chest pain that radiates under her bra line.  Not positional, pleuritic, or exertional.  Not having any fevers.  Says that she has had some lower abdominal pain in the past few days has had nausea and vomiting as well.  No dysuria or frequency.  Says that yesterday she passed out twice while in the shower.  On the second time injured her ankle and is found to have a broken ankle and already saw orthopedics about it and is placed in a splint.  Today was in the shower and passed out again. Concerned it may be from dehydration from the n/v.  Thinks she may have hit her head.  Not having any neck pain.  Not on blood thinners.       Home Medications Prior to Admission medications   Medication Sig Start Date End Date Taking? Authorizing Provider  atorvastatin (LIPITOR) 40 MG tablet Take 1 tablet (40 mg total) by mouth daily. 01/30/23   Loyola Mast, MD  benzonatate (TESSALON) 100 MG capsule Take 1 capsule (100 mg total) by mouth every 8 (eight) hours as needed for cough. 03/10/23   Gustavus Bryant, FNP  cetirizine (ZYRTEC) 10 MG tablet Take 1 tablet (10 mg total) by mouth daily. 02/22/21   Gustavus Bryant, FNP  famotidine (PEPCID) 20 MG tablet Take 1 tablet (20 mg total) by mouth 2 (two) times daily. 07/13/23   Rondel Baton, MD  meloxicam [facility]) 15 MG tablet Take 1 tablet by mouth once daily 07/10/23   Loyola Mast, MD  Multiple Vitamins-Minerals (MULTIVITAMIN WITH  MINERALS) tablet Take 1 tablet by mouth daily.    [provider]  ondansetron (ZOFRAN-ODT) 4 MG disintegrating tablet Take 1 tablet (4 mg total) by mouth every 8 (eight) hours as needed for nausea or vomiting. 07/13/23   Rondel Baton, MD  RESTASIS 0.05 % ophthalmic emulsion  01/26/20   [provider]  spironolactone (ALDACTONE) 50 MG tablet Take 1 tablet by mouth once daily 07/10/23   Loyola Mast, MD  sucralfate (CARAFATE) 1 g tablet Take 1 tablet (1 g total) by mouth 4 (four) times daily -  with meals and at bedtime. 07/13/23   Rondel Baton, MD  VITAMIN D PO Take by mouth daily.    [provider]      Allergies    Patient has no known allergies.    Review of Systems   Review of Systems  Physical Exam Updated Vital Signs BP 123/69   Pulse 100   Temp 98 F (36.7 C) (Oral)   Resp 19   SpO2 96%  Physical Exam Vitals and nursing note reviewed.  Constitutional:      General: She is not in acute distress.    Appearance: She is well-developed.  HENT:     Head: Normocephalic and atraumatic.     Right Ear: External ear normal.     Left Ear:  External ear normal.     Nose: Nose normal.  Eyes:     Extraocular Movements: Extraocular movements intact.     Conjunctiva/sclera: Conjunctivae normal.     Pupils: Pupils are equal, round, and reactive to light.  Neck:     Comments: No C-spine midline tenderness palpation Cardiovascular:     Rate and Rhythm: Normal rate and regular rhythm.     Heart sounds: No murmur heard. Pulmonary:     Effort: Pulmonary effort is normal. No respiratory distress.     Breath sounds: Normal breath sounds.  Abdominal:     General: Abdomen is flat. There is no distension.     Palpations: Abdomen is soft. There is no mass.     Tenderness: There is abdominal tenderness (Periumbilical). There is no guarding.  Musculoskeletal:     Cervical back: Normal range of motion and neck supple.     Right lower leg: No edema.      Left lower leg: No edema.  Skin:    General: Skin is warm and dry.  Neurological:     Mental Status: She is alert and oriented to person, place, and time. Mental status is at baseline.     Cranial Nerves: No cranial nerve deficit.     Sensory: No sensory deficit.     Motor: No weakness.  Psychiatric:        Mood and Affect: Mood normal.     ED Results / Procedures / Treatments   Labs (all labs ordered are listed, but only abnormal results are displayed) Labs Reviewed  BASIC METABOLIC PANEL - Abnormal; Notable for the following components:      Result Value   Chloride 95 (*)    Glucose, Bld 137 (*)    BUN 26 (*)    Calcium 10.8 (*)    Anion gap 25 (*)    All other components within normal limits  CBC - Abnormal; Notable for the following components:   WBC 16.2 (*)    RBC 3.67 (*)    Hemoglobin 10.8 (*)    HCT 32.3 (*)    All other components within normal limits  CBG MONITORING, ED - Abnormal; Notable for the following components:   Glucose-Capillary 111 (*)    All other components within normal limits  RESP PANEL BY RT-PCR (RSV, FLU A&B, COVID)  RVPGX2  TROPONIN I (HIGH SENSITIVITY)  TROPONIN I (HIGH SENSITIVITY)    EKG None  Radiology CT Angio Chest PE W and/or Wo Contrast Result Date: 07/13/2023 CLINICAL DATA:  Pulmonary embolism (PE) suspected, high prob; abdominal pain, nv, syncope Question nondisplaced tibial plateau fracture with vertical lucency irregularity of the tibial plateau noted on lateral view. EXAM: CT ANGIOGRAPHY CHEST CT ABDOMEN AND PELVIS WITH CONTRAST TECHNIQUE: Multidetector CT imaging of the chest was performed using the standard protocol during bolus administration of intravenous contrast. Multiplanar CT image reconstructions and MIPs were obtained to evaluate the vascular anatomy. Multidetector CT imaging of the abdomen and pelvis was performed using the standard protocol during bolus administration of intravenous contrast. RADIATION DOSE REDUCTION:  This exam was performed according to the departmental dose-optimization program which includes automated exposure control, adjustment of the mA and/or kV according to patient size and/or use of iterative reconstruction technique. CONTRAST:  85mL OMNIPAQUE IOHEXOL 350 MG/ML SOLN COMPARISON:  None Available. FINDINGS: CTA CHEST FINDINGS Cardiovascular: Satisfactory opacification of the pulmonary arteries to the segmental level. No evidence of pulmonary embolism. Normal heart size. No significant pericardial effusion. The  thoracic aorta is normal in caliber. No atherosclerotic plaque of the thoracic aorta. No coronary artery calcifications. Mediastinum/Nodes: No enlarged mediastinal, hilar, or axillary lymph nodes. Thyroid gland, trachea, and esophagus demonstrate no significant findings. Lungs/Pleura: No focal consolidation. No pulmonary nodule. No pulmonary mass. No pleural effusion. No pneumothorax. Musculoskeletal: Review of the MIP images confirms the above findings. CT ABDOMEN and PELVIS FINDINGS Hepatobiliary: No focal liver abnormality. No gallstones, gallbladder wall thickening, or pericholecystic fluid. No biliary dilatation. Pancreas: No focal lesion. Normal pancreatic contour. No surrounding inflammatory changes. No main pancreatic ductal dilatation. Spleen: Normal in size without focal abnormality. Adrenals/Urinary Tract: No adrenal nodule bilaterally. Bilateral kidneys enhance symmetrically. No hydronephrosis. No hydroureter. Hypodense lesion within the right kidney likely represents a simple renal cyst. Simple renal cysts, in the absence of clinically indicated signs/symptoms, require no independent follow-up. The urinary bladder is unremarkable. Stomach is within normal limits. No evidence of bowel wall thickening or dilatation. Appendix appears normal. Stomach/Bowel: Gastric antrum circumferential wall thickening with associated outpouching that could represent ulceration (2:33). Otherwise no evidence  of small or large bowel wall thickening or dilatation. The appendix is not definitely identified with no inflammatory changes in the right lower quadrant to suggest acute appendicitis. Vascular/Lymphatic: No abdominal aorta or iliac aneurysm. No abdominal, pelvic, or inguinal lymphadenopathy. Reproductive: Status post hysterectomy. No adnexal masses. Other: No intraperitoneal free fluid. No intraperitoneal free gas. No organized fluid collection. Musculoskeletal: No abdominal wall hernia or abnormality. No suspicious lytic or blastic osseous lesions. No acute displaced fracture. Review of the MIP images confirms the above findings. IMPRESSION: 1. No pulmonary embolus. 2. No acute intrathoracic abnormality. 3. Gastric antrum circumferential wall thickening with associated outpouching that could represent ulceration. Concern for gastritis. Underlying malignancy not fully excluded. Recommend further evaluation with endoscopy. 4. Status post hysterectomy. Electronically Signed   By: Tish Frederickson M.D.   On: 07/13/2023 18:31   CT ABDOMEN PELVIS W CONTRAST Result Date: 07/13/2023 CLINICAL DATA:  Pulmonary embolism (PE) suspected, high prob; abdominal pain, nv, syncope Question nondisplaced tibial plateau fracture with vertical lucency irregularity of the tibial plateau noted on lateral view. EXAM: CT ANGIOGRAPHY CHEST CT ABDOMEN AND PELVIS WITH CONTRAST TECHNIQUE: Multidetector CT imaging of the chest was performed using the standard protocol during bolus administration of intravenous contrast. Multiplanar CT image reconstructions and MIPs were obtained to evaluate the vascular anatomy. Multidetector CT imaging of the abdomen and pelvis was performed using the standard protocol during bolus administration of intravenous contrast. RADIATION DOSE REDUCTION: This exam was performed according to the departmental dose-optimization program which includes automated exposure control, adjustment of the mA and/or kV according  to patient size and/or use of iterative reconstruction technique. CONTRAST:  85mL OMNIPAQUE IOHEXOL 350 MG/ML SOLN COMPARISON:  None Available. FINDINGS: CTA CHEST FINDINGS Cardiovascular: Satisfactory opacification of the pulmonary arteries to the segmental level. No evidence of pulmonary embolism. Normal heart size. No significant pericardial effusion. The thoracic aorta is normal in caliber. No atherosclerotic plaque of the thoracic aorta. No coronary artery calcifications. Mediastinum/Nodes: No enlarged mediastinal, hilar, or axillary lymph nodes. Thyroid gland, trachea, and esophagus demonstrate no significant findings. Lungs/Pleura: No focal consolidation. No pulmonary nodule. No pulmonary mass. No pleural effusion. No pneumothorax. Musculoskeletal: Review of the MIP images confirms the above findings. CT ABDOMEN and PELVIS FINDINGS Hepatobiliary: No focal liver abnormality. No gallstones, gallbladder wall thickening, or pericholecystic fluid. No biliary dilatation. Pancreas: No focal lesion. Normal pancreatic contour. No surrounding inflammatory changes. No main pancreatic ductal dilatation. Spleen:  Normal in size without focal abnormality. Adrenals/Urinary Tract: No adrenal nodule bilaterally. Bilateral kidneys enhance symmetrically. No hydronephrosis. No hydroureter. Hypodense lesion within the right kidney likely represents a simple renal cyst. Simple renal cysts, in the absence of clinically indicated signs/symptoms, require no independent follow-up. The urinary bladder is unremarkable. Stomach is within normal limits. No evidence of bowel wall thickening or dilatation. Appendix appears normal. Stomach/Bowel: Gastric antrum circumferential wall thickening with associated outpouching that could represent ulceration (2:33). Otherwise no evidence of small or large bowel wall thickening or dilatation. The appendix is not definitely identified with no inflammatory changes in the right lower quadrant to suggest  acute appendicitis. Vascular/Lymphatic: No abdominal aorta or iliac aneurysm. No abdominal, pelvic, or inguinal lymphadenopathy. Reproductive: Status post hysterectomy. No adnexal masses. Other: No intraperitoneal free fluid. No intraperitoneal free gas. No organized fluid collection. Musculoskeletal: No abdominal wall hernia or abnormality. No suspicious lytic or blastic osseous lesions. No acute displaced fracture. Review of the MIP images confirms the above findings. IMPRESSION: 1. No pulmonary embolus. 2. No acute intrathoracic abnormality. 3. Gastric antrum circumferential wall thickening with associated outpouching that could represent ulceration. Concern for gastritis. Underlying malignancy not fully excluded. Recommend further evaluation with endoscopy. 4. Status post hysterectomy. Electronically Signed   By: Tish Frederickson M.D.   On: 07/13/2023 18:31   CT Head Wo Contrast Result Date: 07/13/2023 CLINICAL DATA:  head trauma, n/v EXAM: CT HEAD WITHOUT CONTRAST TECHNIQUE: Contiguous axial images were obtained from the base of the skull through the vertex without intravenous contrast. RADIATION DOSE REDUCTION: This exam was performed according to the departmental dose-optimization program which includes automated exposure control, adjustment of the mA and/or kV according to patient size and/or use of iterative reconstruction technique. COMPARISON:  None Available. FINDINGS: Brain: Patchy and confluent areas of decreased attenuation are noted throughout the deep and periventricular white matter of the cerebral hemispheres bilaterally, compatible with chronic microvascular ischemic disease. No evidence of large-territorial acute infarction. No parenchymal hemorrhage. No mass lesion. No extra-axial collection. No mass effect or midline shift. No hydrocephalus. Basilar cisterns are patent. Vascular: No hyperdense vessel. Atherosclerotic calcifications are present within the cavernous internal carotid arteries.  Skull: No acute fracture or focal lesion. Sinuses/Orbits: Paranasal sinuses and mastoid air cells are clear. The orbits are unremarkable. Other: None. IMPRESSION: No acute intracranial abnormality. Electronically Signed   By: Tish Frederickson M.D.   On: 07/13/2023 18:17   DG Ankle Complete Left Result Date: 07/13/2023 CLINICAL DATA:  Left ankle pain.  Fall. EXAM: LEFT ANKLE COMPLETE - 3+ VIEW COMPARISON:  None Available. FINDINGS: There is probable acute undisplaced fracture of the lateral malleolus with mild-to-moderate overlying soft tissue swelling. No other acute fracture or dislocation. No aggressive osseous lesion. Ankle mortise appears intact. No focal soft tissue swelling. No radiopaque foreign bodies. IMPRESSION: *Suspected acute undisplaced fracture of the lateral malleolus with mild-to-moderate overlying soft tissue swelling. Electronically Signed   By: Jules Schick M.D.   On: 07/13/2023 16:37   DG Chest 2 View Result Date: 07/13/2023 CLINICAL DATA:  URI and epigastric pain. EXAM: CHEST - 2 VIEW COMPARISON:  03/12/2010. FINDINGS: Bilateral lung fields are clear. Bilateral costophrenic angles are clear. Normal cardio-mediastinal silhouette. No acute osseous abnormalities. The soft tissues are within normal limits. IMPRESSION: No active cardiopulmonary disease. Electronically Signed   By: Jules Schick M.D.   On: 07/13/2023 16:33    Procedures Procedures    Medications Ordered in ED Medications  sodium chloride 0.9 % bolus 1,000  mL (0 mLs Intravenous Stopped 07/13/23 1850)  ondansetron (ZOFRAN) injection 4 mg (4 mg Intravenous Given 07/13/23 1648)  iohexol (OMNIPAQUE) 350 MG/ML injection 100 mL (85 mLs Intravenous Contrast Given 07/13/23 1725)  HYDROmorphone (DILAUDID) injection 0.5 mg (0.5 mg Intravenous Given 07/13/23 1844)  famotidine (PEPCID) IVPB 20 mg premix (0 mg Intravenous Stopped 07/13/23 1916)  sodium chloride 0.9 % bolus 1,000 mL (0 mLs Intravenous Stopped 07/13/23 1952)    ED  Course/ Medical Decision Making/ A&P Clinical Course as of 07/14/23 1200  Wed Jul 13, 2023  1842 CT ABDOMEN PELVIS W CONTRAST Gastric antrum circumferential wall thickening with associated outpouching that could represent ulceration. Concern for gastritis. Underlying malignancy not fully excluded. Recommend further evaluation with endoscopy. [RP]    Clinical Course User Index [RP] Rondel Baton, MD                                 Medical Decision Making Amount and/or Complexity of Data Reviewed Labs: ordered. Radiology: ordered. Decision-making details documented in ED Course.  Risk Prescription drug management.   EDELYN Patricia Dunlap is a 58 y.o. female with comorbidities that complicate the patient evaluation including anemia and hyperlipidemia who presents to the emergency department with syncopal event in the setting of URI symptoms and nausea and vomiting  Initial Ddx:  Dehydration, orthostatic syncope, URI, pneumonia, PE, intra-abdominal abscess/infection, gastroenteritis, electrolyte abnormality, arrhythmia, TBI  MDM/Course:  Patient presents emergency department with multiple syncopal episodes.  Has been having URI symptoms as well as some nausea and vomiting and diarrhea.  On exam she does not appear to be in acute distress and she has lungs are clear to auscultation bilaterally.  Is having some suprapubic tenderness to palpation.  Has a nonfocal neuroexam.  She is tachycardic which I suspect is due to volume losses from her GI illness.  She is given 2 L of fluids with improvement of her heart rate.  With her chest pain and abdominal pain and syncopal episodes did obtain a CT of the chest to evaluate for PE and abdomen pelvis to evaluate for any sort of intra-abdominal infection or bleeding that could be causing her syncope and other symptoms.  These were negative aside from some thickening of the stomach that is likely due to gastritis but underlying malignancy cannot be  excluded.  She was notified of these findings and the fact that she will need to follow-up with GI.  CT of the head with her head strike and persistent nausea and vomiting was also negative.  Electrolytes WNL except for a mildly elevated calcium at 10.8 which I do not suspect is causing her symptoms.  Her EKG did not show any concerning findings such as Brugada, long QT, or WPW.  She is tolerating p.o. in the emergency department.  Feel that her symptoms were likely due to dehydration and orthostasis.  Will have her follow-up with her primary doctor and GI.  This patient presents to the ED for concern of complaints listed in HPI, this involves an extensive number of treatment options, and is a complaint that carries with it a high risk of complications and morbidity. Disposition including potential need for admission considered.   Dispo: DC Home. Return precautions discussed including, but not limited to, those listed in the AVS. Allowed pt time to ask questions which were answered fully prior to dc.  Additional history obtained from family Records reviewed Outpatient Clinic Notes The  following labs were independently interpreted: Chemistry and show no acute abnormality I independently reviewed the following imaging with scope of interpretation limited to determining acute life threatening conditions related to emergency care: Chest x-ray and agree with the radiologist interpretation with the following exceptions: none I personally reviewed and interpreted cardiac monitoring: sinus tachycardia I personally reviewed and interpreted the pt's EKG: see above for interpretation  I have reviewed the patients home medications and made adjustments as needed  Portions of this note were generated with Dragon dictation software. Dictation errors may occur despite best attempts at proofreading.     Final Clinical Impression(s) / ED Diagnoses Final diagnoses:  Dehydration  Syncope, unspecified syncope type   Gastritis without bleeding, unspecified chronicity, unspecified gastritis type  Minor head injury, initial encounter    Rx / DC Orders ED Discharge Orders          Ordered    famotidine (PEPCID) 20 MG tablet  2 times daily,   Status:  Discontinued        07/13/23 1957    ondansetron (ZOFRAN-ODT) 4 MG disintegrating tablet  Every 8 hours PRN,   Status:  Discontinued        07/13/23 1957    sucralfate (CARAFATE) 1 g tablet  3 times daily with meals & bedtime,   Status:  Discontinued        07/13/23 1957    Ambulatory referral to Gastroenterology        07/13/23 1958    famotidine (PEPCID) 20 MG tablet  2 times daily        07/13/23 2020    ondansetron (ZOFRAN-ODT) 4 MG disintegrating tablet  Every 8 hours PRN        07/13/23 2020    sucralfate (CARAFATE) 1 g tablet  3 times daily with meals & bedtime        07/13/23 2020              Rondel Baton, MD 07/14/23 1200

## 2023-07-13 NOTE — Discharge Instructions (Signed)
 You were seen for your pain, nausea and vomiting, and fainting in the emergency department.  Your fainting was likely due to dehydration.  At home, please stay well-hydrated.  Take the Pepcid  and Carafate  for your gastritis.  Take the Zofran  as needed for any nausea or vomiting.    Check your MyChart online for the results of any tests that had not resulted by the time you left the emergency department.   Follow-up with your primary doctor in 2-3 days regarding your visit.  Follow-up with the gastroenterologist about your gastritis to see if you need endoscopy.  Return immediately to the emergency department if you experience any of the following: Recurrent fainting, or any other concerning symptoms.    Thank you for visiting our Emergency Department. It was a pleasure taking care of you today.

## 2023-07-13 NOTE — ED Notes (Signed)
 PO challenge complete. Pt was able to tolerate drinking cranberry juice.

## 2023-07-13 NOTE — ED Notes (Signed)
 Patient transported to CT

## 2023-07-13 NOTE — Telephone Encounter (Signed)
  Chief Complaint: 2 syncopal episodes yesterday  Symptoms: syncopal episode x 2, left face tender/painful, tailbone soreness, left ankle fracture  Frequency: 2-3 minutes of loss of consciousness yesterday morning  Pertinent Negatives: Patient denies chest pain, difficulty breathing, palpitations, headaches, weakness, numbness, abdominal pain, diarrhea, vertigo, blood in stools Disposition: [x] ED /[] Urgent Care (no appt availability in office) / [] Appointment(In office/virtual)/ []  [facility]/ [] Home Care/ [] Refused Recommended Disposition /[] [facility]/ []  Follow-up with PCP Additional Notes: Patient states first syncopal episode was while showering and then happened again while trying to get to her bed. Patient unsure if she sustained a head injury but complains of left sided facial pain. Patient states she was seen yesterday by orthopedics and placed in a boot for left ankle fracture. She states orthopedic provider told her to follow up with PCP. Patient agreeable to go to ED.   Copied from CRM [id number]. Topic: Clinical - Red Word Triage >> Jul 13, 2023  8:07 AM Margarette Shawl wrote: Red Word that prompted transfer to Nurse Triage: patient passed out yesterday and fractured her left ankle. Was seen at urgent care and told to follow up with primary. Still has swelling and has been using Aleve . Reason for Disposition  Fainted 2 times in one day  Answer Assessment - Initial Assessment Questions 1. ONSET: "How long were you unconscious?" (minutes) "When did it happen?"     Yesterday morning, two episodes of passing out. 2-3 minutes.  2. CONTENT: "What happened during period of unconsciousness?" (e.g., seizure activity)      Denies seizure activity; patient states she has had bronchitis and was feeling unwell while showering.  3. MENTAL STATUS: "Alert and oriented now?" (oriented x 3 = name, month, location)      Oriented x3.  4. TRIGGER: "What do you think caused the  fainting?" "What were you doing just before you fainted?"  (e.g., exercise, sudden standing up, prolonged standing)     Patient states the water was hot and she has been on a lot of medication for the bronchitis (including cough syrup).  5. RECURRENT SYMPTOM: "Have you ever passed out before?" If Yes, ask: "When was the last time?" and "What happened that time?"      Denies passing out but states she has been in the shower before and felt lightheaded.  6. INJURY: "Did you sustain any injury during the fall?"      Left ankle fracture. Patient was examined yesterday by orthopedics and placed in a walking boot.  7. CARDIAC SYMPTOMS: "Have you had any of the following symptoms: chest pain, difficulty breathing, palpitations?"     Denies.  8. NEUROLOGIC SYMPTOMS: "Have you had any of the following symptoms: headache, numbness, vertigo, weakness?"     Left side of face is tender.  9. GI SYMPTOMS: "Have you had any of the following symptoms: abdomen pain, vomiting, diarrhea, blood in stools?"     Nausea and vomiting the night prior to the syncope.  10. OTHER SYMPTOMS: "Do you have any other symptoms?"       Sore tailbone.  Protocols used: [employer]

## 2023-07-13 NOTE — ED Notes (Signed)
 AVS provided by edp was discussed with pt. Pt verbalized understand with no additional questions at this time. Pt verified pharmacy. Wheelchair to car going home with family at bedside

## 2023-07-14 ENCOUNTER — Other Ambulatory Visit: Payer: Self-pay

## 2023-07-14 ENCOUNTER — Other Ambulatory Visit (HOSPITAL_BASED_OUTPATIENT_CLINIC_OR_DEPARTMENT_OTHER): Payer: Self-pay

## 2023-07-14 NOTE — Telephone Encounter (Signed)
Called patient and scheduled her for a hospital f/u on 07/20/23 @ 2;00 pm. . Dm/cma

## 2023-07-15 ENCOUNTER — Telehealth: Payer: Self-pay

## 2023-07-15 NOTE — Transitions of Care (Post Inpatient/ED Visit) (Signed)
   07/15/2023  Name: Patricia Dunlap MRN: [id number] DOB: 1966-01-02  Today's TOC FU Call Status: Today's TOC FU Call Status:: Successful TOC FU Call Completed TOC FU Call Complete Date: 07/15/23 Patient's Name and Date of Birth confirmed.  Transition Care Management Follow-up Telephone Call Date of Discharge: 07/13/23 Discharge Facility: [facility] ([facility]) Type of Discharge: Emergency Department How have you been since you were released from the hospital?: Better Any questions or concerns?: No  Items Reviewed: Did you receive and understand the discharge instructions provided?: Yes Medications obtained,verified, and reconciled?: Yes (Medications Reviewed) Any new allergies since your discharge?: No Dietary orders reviewed?: NA Do you have support at home?: Yes  Medications Reviewed Today: Medications Reviewed Today   Medications were not reviewed in this encounter     Home Care and Equipment/Supplies: Were Home Health Services Ordered?: No Any new equipment or medical supplies ordered?: No  Functional Questionnaire: Do you need assistance with bathing/showering or dressing?: No Do you need assistance with meal preparation?: No Do you need assistance with eating?: No Do you have difficulty maintaining continence: No Do you need assistance with getting out of bed/getting out of a chair/moving?: No Do you have difficulty managing or taking your medications?: No  Follow up appointments reviewed: PCP Follow-up appointment confirmed?: No Do you need transportation to your follow-up appointment?: No Do you understand care options if your condition(s) worsen?: Yes-patient verbalized understanding    SIGNATURE Arvil Persons, BSN, RN

## 2023-07-15 NOTE — Transitions of Care (Post Inpatient/ED Visit) (Deleted)
   07/15/2023  Name: Patricia Dunlap MRN: [id number] DOB: 05-26-1966  Today's TOC FU Call Status: Today's TOC FU Call Status:: Successful TOC FU Call Completed TOC FU Call Complete Date: 07/15/23 Patient's Name and Date of Birth confirmed.  Transition Care Management Follow-up Telephone Call Date of Discharge: 07/13/23 Discharge Facility: [facility] ([facility]) Type of Discharge: Emergency Department How have you been since you were released from the hospital?: Better Any questions or concerns?: No  Items Reviewed:    Medications Reviewed Today: Medications Reviewed Today   Medications were not reviewed in this encounter     Home Care and Equipment/Supplies:    Functional Questionnaire:    Follow up appointments reviewed:      SIGNATURE Arvil Persons, BSN, RN

## 2023-07-15 NOTE — Telephone Encounter (Signed)
Called patient and changed her appt to Monday 07/18/23 @ 2:00 pm.   Dm/cma

## 2023-07-18 ENCOUNTER — Encounter: Payer: Self-pay | Admitting: Family Medicine

## 2023-07-18 ENCOUNTER — Ambulatory Visit: Payer: 59 | Admitting: Family Medicine

## 2023-07-18 VITALS — BP 116/70 | HR 78 | Temp 98.2°F | Ht 60.0 in | Wt 123.6 lb

## 2023-07-18 DIAGNOSIS — D500 Iron deficiency anemia secondary to blood loss (chronic): Secondary | ICD-10-CM | POA: Diagnosis not present

## 2023-07-18 DIAGNOSIS — R935 Abnormal findings on diagnostic imaging of other abdominal regions, including retroperitoneum: Secondary | ICD-10-CM | POA: Diagnosis not present

## 2023-07-18 DIAGNOSIS — J40 Bronchitis, not specified as acute or chronic: Secondary | ICD-10-CM

## 2023-07-18 DIAGNOSIS — R55 Syncope and collapse: Secondary | ICD-10-CM

## 2023-07-18 DIAGNOSIS — S8265XD Nondisplaced fracture of lateral malleolus of left fibula, subsequent encounter for closed fracture with routine healing: Secondary | ICD-10-CM

## 2023-07-18 MED ORDER — ALBUTEROL SULFATE HFA 108 (90 BASE) MCG/ACT IN AERS: 2.0000 | INHALATION_SPRAY | Freq: Four times a day (QID) | RESPIRATORY_TRACT | 2 refills | Status: DC | PRN

## 2023-07-18 NOTE — Progress Notes (Signed)
[facility] [address] [place] [address] Dept: [phone] Dept Fax: [phone]  Office Visit  Subjective:    Patient ID: Patricia Dunlap, female    DOB: 02-Jun-1966, 58 y.o..   MRN: [id number]  Chief Complaint  Patient presents with   Hospitalization Follow-up    Hospital f/u after falling and    History of Present Illness:  Patient is in today for follow-up from a recent ED visit. Patricia Dunlap was seen at [facility] on 1/15 related to 2 syncopal spells. She had been experiencing bronchitis for the two weeks prior to the event. For a few days prior, she had some lower abdominal pain with nausea and vomiting.  The first syncopal spell occurred while in the shower. Once she had awakened from this. she attempted to get up and go to her bedroom. This led to her passing out a second time. She suffered a nondisplaced left lateral malleolar fracture for which she saw orthopedics. They put her in a walking boot.  She then was seen in the ED for evaluation. She was felt to be dehydrated, but also found to be mildly anemic. She feels better at this point. She is now on omeprazole, famotidine, and sucralfate. She has an appointment to see GI on Feb. 11.  Past Medical History: Patient Active Problem List   Diagnosis Date Noted   Menopause 06/05/2021   Vitamin D insufficiency 06/05/2021   Prediabetes 06/05/2021   Hyperlipidemia 11/28/2017   Vaginal atrophy 11/28/2017   Acne 11/28/2017   Allergic rhinitis 09/16/2010   Heart murmur 09/16/2010   Past Surgical History:  Procedure Laterality Date   ABDOMINAL HYSTERECTOMY     BUNIONECTOMY     COLONOSCOPY  11/04/2016   Dr.Pyrtle   POLYPECTOMY     WISDOM TOOTH EXTRACTION     age 41   Family History  Problem Relation Age of Onset   Hyperlipidemia Mother    Hypertension Father    Other Father        Supranuclear palsy   Colon cancer Neg Hx    Colon polyps Neg Hx     Esophageal cancer Neg Hx    Stomach cancer Neg Hx    Rectal cancer Neg Hx    Outpatient Medications Prior to Visit  Medication Sig Dispense Refill   atorvastatin (LIPITOR) 40 MG tablet Take 1 tablet (40 mg total) by mouth daily. 90 tablet 3   benzonatate (TESSALON) 100 MG capsule Take 1 capsule (100 mg total) by mouth every 8 (eight) hours as needed for cough. 21 capsule 0   cetirizine (ZYRTEC) 10 MG tablet Take 1 tablet (10 mg total) by mouth daily. 30 tablet 0   famotidine (PEPCID) 20 MG tablet Take 1 tablet (20 mg total) by mouth 2 (two) times daily. 30 tablet 0   meloxicam (MOBIC) 15 MG tablet Take 1 tablet by mouth once daily 90 tablet 0   Multiple Vitamins-Minerals (MULTIVITAMIN WITH MINERALS) tablet Take 1 tablet by mouth daily.     ondansetron (ZOFRAN-ODT) 4 MG disintegrating tablet Take 1 tablet (4 mg total) by mouth every 8 (eight) hours as needed for nausea or vomiting. 12 tablet 0   RESTASIS 0.05 % ophthalmic emulsion      spironolactone (ALDACTONE) 50 MG tablet Take 1 tablet by mouth once daily 90 tablet 3   sucralfate (CARAFATE) 1 g tablet Take 1 tablet (1 g total) by mouth 4 (four) times daily -  with meals and  at bedtime. 30 tablet 2   VITAMIN D PO Take by mouth daily.     No facility-administered medications prior to visit.   No Known Allergies   Objective:   Today's Vitals   07/18/23 1346  BP: 116/70  Pulse: 78  Temp: 98.2 F (36.8 C)  TempSrc: Temporal  SpO2: 96%  Weight: 123 lb 9.6 oz (56.1 kg)  Height: 5' (1.524 m)   Body mass index is 24.14 kg/m.   General: Well developed, well nourished. No acute distress. Lungs: There are some mild basilar rhonchi present. Psych: Alert and oriented. Normal mood and affect.  Health Maintenance Due  Topic Date Due   HIV Screening  Never done   Lab Results    Latest Ref Rng & Units 07/13/2023    2:08 PM 12/27/2018   11:11 AM 11/28/2017   11:02 AM  CBC  WBC 4.0 - 10.5 K/uL 16.2  4.2  4.6   Hemoglobin 12.0 - 15.0  g/dL 29.5  28.4  13.2   Hematocrit 36.0 - 46.0 % 32.3  37.1  37.5   Platelets 150 - 400 K/uL 394  273.0  233.0       Latest Ref Rng & Units 07/13/2023    2:08 PM 01/28/2023    8:56 AM 05/14/2021   12:00 AM  CMP  Glucose 70 - 99 mg/dL 440  90    BUN 6 - 20 mg/dL 26  26  14       Creatinine 0.44 - 1.00 mg/dL 1.02  7.25  0.8      Sodium 135 - 145 mmol/L 144  139    Potassium 3.5 - 5.1 mmol/L 4.8  4.7    Chloride 98 - 111 mmol/L 95  104    CO2 22 - 32 mmol/L 24  27    Calcium 8.9 - 10.3 mg/dL 36.6  44.0    AST 13 - 35   26      ALT 7 - 35   31         This result is from an external source.   Imaging: Left Ankle X-ray (07/12/2023) IMPRESSION: *Suspected acute undisplaced fracture of the lateral malleolus with mild-to-moderate overlying soft tissue swelling.  Chest x-ray (07/13/2023) IMPRESSION: No active cardiopulmonary disease.  CT Angio of Chest (07/13/2023) CT of Abdomen and Pelvis IMPRESSION: 1. No pulmonary embolus. 2. No acute intrathoracic abnormality. 3. Gastric antrum circumferential wall thickening with associated outpouching that could represent ulceration. Concern for gastritis. Underlying malignancy not fully excluded. Recommend further evaluation with endoscopy. 4. Status post hysterectomy.  CT of Head wo contrast (07/13/2023) IMPRESSION: No acute intracranial abnormality.    Assessment & Plan:   Problem List Items Addressed This Visit   None Visit Diagnoses       Closed nondisplaced fracture of lateral malleolus of left fibula with routine healing, subsequent encounter    -  Primary   Continue use of a walking boot and follow-up with orthopedics.     Syncope, unspecified syncope type       Likely due to dehydration, recent illness, and mild anemia. Improved currently.     Iron deficiency anemia due to chronic blood loss       Likely due to GI blood loss in ligh tof abnormal abdominal CT scan.     Abnormal CT of the abdomen- possible gastric ulcer        Continue a PPI and sucralfate. I agree with referral to GI to consider endoscopy. Stop  meloxicam and use Tylenol as needed for back pain.     Bronchitis       I will try Patricia Dunlap on an albuterol inhaler and see if this improves her lingering cough.   Relevant Medications   albuterol (VENTOLIN HFA) 108 (90 Base) MCG/ACT inhaler       Return if symptoms worsen or fail to improve.   Loyola Mast, MD

## 2023-07-20 ENCOUNTER — Inpatient Hospital Stay: Payer: 59 | Admitting: Family Medicine

## 2023-08-08 NOTE — Progress Notes (Signed)
Chief Complaint: Follow-up ED visit, abdominal pain, abnormal CT scan Primary GI Doctor: Dr. Rhea Belton  HPI: Patient is a 58 year old  African American female patient with past medical history of  anemia, heart murmur, hyperlipidemia, who was referred to me by Loyola Mast, MD on 07/13/23 following ED visit for abdominal pain and abnormal CT scan.  07/13/2023 patient seen at [facility] ED with a syncopal episode.  Patient fell at home.  CT of the head was negative.  Patient was diagnosed with bronchitis 2 weeks prior and experiencing left-sided chest pain that radiates under her bra line.  CT chest negative.  Patient also complained of lower abdominal pain, nausea and vomiting.  CT scan showed gastric atrium circumferential wall thickening with associated outpouching that could represent ulceration.  Concerns for gastritis.  Underlining malignancy not fully excluded.  Recommend further evaluation with endoscopy.  Electrolytes within normal limits except for mildly elevated calcium at 10.8.  Patient given 2 L of fluid and discharged.  Patient last seen by Dr. Rhea Belton on 03/12/2022 for surveillance colonoscopy. Unremarkable.  07/13/23 CT ABD/pelvis  IMPRESSION: 1. No pulmonary embolus. 2. No acute intrathoracic abnormality. 3. Gastric antrum circumferential wall thickening with associated outpouching that could represent ulceration. Concern for gastritis. Underlying malignancy not fully excluded. Recommend further evaluation with endoscopy. 4. Status post hysterectomy.  Interval History    Patient presents for follow-up after recent ED visit. She had reported epigastric pain with nausea and vomiting. CT showed gastric antrum inflammation concerning for gastritis and ulceration. Patient was placed on Pepcid 20mg  twice daily for 4 weeks and Caraftae 1 gram with meals which she states helped with the nausea and vomiting. She continues with dull schy pain in RUQ. The pain is worse on empty stomach and  better with eating food. No history of GERD.Patient has had intermittent issues with pyrosis since December. Denies dysphagia. Appetite good. No weight loss She was taking Meloxicam about 3-4 times a week for back pain, but recently taken off and switched to Tylenol.No significant family medical history. No hx of stomach CA in family. Nonsmoker.Drinks alcohol socially. Patient denies tarry stools or blood in stools. One episode of dark emesis right before she went to ED in January. Reports fatigue. Never had EGD.  Wt Readings from Last 3 Encounters:  08/09/23 121 lb (54.9 kg)  07/18/23 123 lb 9.6 oz (56.1 kg)  12/15/22 120 lb 9.6 oz (54.7 kg)    Past Medical History:  Diagnosis Date   Allergy    Anemia    resolved after hysterectomy   Heart murmur    Hyperlipidemia     Past Surgical History:  Procedure Laterality Date   ABDOMINAL HYSTERECTOMY     BUNIONECTOMY     COLONOSCOPY  11/04/2016   Dr.Pyrtle   POLYPECTOMY     WISDOM TOOTH EXTRACTION     age 59    Current Outpatient Medications  Medication Sig Dispense Refill   albuterol (VENTOLIN HFA) 108 (90 Base) MCG/ACT inhaler Inhale 2 puffs into the lungs every 6 (six) hours as needed for wheezing or shortness of breath. 8 g 2   atorvastatin (LIPITOR) 40 MG tablet Take 1 tablet (40 mg total) by mouth daily. 90 tablet 3   cetirizine (ZYRTEC) 10 MG tablet Take 1 tablet (10 mg total) by mouth daily. 30 tablet 0   famotidine (PEPCID) 20 MG tablet Take 1 tablet (20 mg total) by mouth 2 (two) times daily. 30 tablet 0   Multiple Vitamins-Minerals (  MULTIVITAMIN WITH MINERALS) tablet Take 1 tablet by mouth daily.     ondansetron (ZOFRAN-ODT) 4 MG disintegrating tablet Take 1 tablet (4 mg total) by mouth every 8 (eight) hours as needed for nausea or vomiting. 12 tablet 0   RESTASIS 0.05 % ophthalmic emulsion      spironolactone (ALDACTONE) 50 MG tablet Take 1 tablet by mouth once daily 90 tablet 3   sucralfate (CARAFATE) 1 g tablet Take 1  tablet (1 g total) by mouth 4 (four) times daily -  with meals and at bedtime. 30 tablet 2   VITAMIN D PO Take by mouth daily.     No current facility-administered medications for this visit.    Allergies as of 08/09/2023   (No Known Allergies)    Family History  Problem Relation Age of Onset   Hyperlipidemia Mother    Hypertension Father    Other Father        Supranuclear palsy   Colon cancer Neg Hx    Colon polyps Neg Hx    Esophageal cancer Neg Hx    Stomach cancer Neg Hx    Rectal cancer Neg Hx     Review of Systems:    Constitutional: No weight loss, fever, chills, weakness or fatigue HEENT: Eyes: No change in vision               Ears, Nose, Throat:  No change in hearing or congestion Skin: No rash or itching Cardiovascular: No chest pain, chest pressure or palpitations   Respiratory: No SOB or cough Gastrointestinal: See HPI and otherwise negative Genitourinary: No dysuria or change in urinary frequency Neurological: No headache, dizziness or syncope Musculoskeletal: No new muscle or joint pain Hematologic: No bleeding or bruising Psychiatric: No history of depression or anxiety    Physical Exam:  Vital signs: BP 108/64   Pulse 65   Ht 5' (1.524 m)   Wt 121 lb (54.9 kg) Comment: has ortho boot on  BMI 23.63 kg/m   Constitutional:   Pleasant African American female appears to be in NAD, Well developed, Well nourished, alert and cooperative Neck:  Supple Throat: Oral cavity and pharynx without inflammation, swelling or lesion.  Respiratory: Respirations even and unlabored. Lungs clear to auscultation bilaterally.   No wheezes, crackles, or rhonchi.  Cardiovascular: Normal S1, S2. Regular rate and rhythm. No peripheral edema, cyanosis or pallor.  Gastrointestinal:  Soft, nondistended, epigastric tenderness with palpation. No rebound or guarding. Normal bowel sounds. No appreciable masses or hepatomegaly. Rectal:  Not performed.  Msk:  Symmetrical without  gross deformities. Without edema, no deformity or joint abnormality.  Neurologic:  Alert and  oriented x4;  grossly normal neurologically.  Skin:   Dry and intact without significant lesions or rashes. Psychiatric: Oriented to person, place and time. Demonstrates good judgement and reason without abnormal affect or behaviors.  RELEVANT LABS AND IMAGING: CBC    Latest Ref Rng & Units 07/13/2023    2:08 PM 12/27/2018   11:11 AM 11/28/2017   11:02 AM  CBC  WBC 4.0 - 10.5 K/uL 16.2  4.2  4.6   Hemoglobin 12.0 - 15.0 g/dL 16.1  09.6  04.5   Hematocrit 36.0 - 46.0 % 32.3  37.1  37.5   Platelets 150 - 400 K/uL 394  273.0  233.0     CMP     Latest Ref Rng & Units 07/13/2023    2:08 PM 01/28/2023    8:56 AM 05/14/2021   12:00 AM  CMP  Glucose 70 - 99 mg/dL 562  90    BUN 6 - 20 mg/dL 26  26  14       Creatinine 0.44 - 1.00 mg/dL 1.30  8.65  0.8      Sodium 135 - 145 mmol/L 144  139    Potassium 3.5 - 5.1 mmol/L 4.8  4.7    Chloride 98 - 111 mmol/L 95  104    CO2 22 - 32 mmol/L 24  27    Calcium 8.9 - 10.3 mg/dL 78.4  69.6    AST 13 - 35   26      ALT 7 - 35   31         This result is from an external source.    Lab Results  Component Value Date   TSH 0.92 05/14/2021  03/12/22 colonoscopy, recall 9/33 Impression:  - The entire examined colon is normal.  - Small internal and external hemorrhoids.  - No specimens collected. 11/04/16 colonoscopy Impression:  - One 6 mm polyp in the ascending colon, removed with a cold snare. Resected and retrieved.  - One 5 mm polyp at the hepatic flexure, removed with a cold snare. Resected and retrieved.  - The examination was otherwise normal on direct and retroflexion views. 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 - NO HIGH GRADE DYSPLASIA OR MALIGNANCY. Assessment:     58 year old African-American female patient presents for follow-up  after recent ED visit where patient had syncopal episode with nausea vomiting and abdominal pain.  CT scan showed Gastric antrum circumferential wall thickening with associated outpouching that could represent ulceration. Concern for gastritis. Underlying malignancy not fully excluded.  Patient was started on Pepcid 20 mg twice daily and Carafate 1 g with meals.  She ran out of famotidine but has continued the Carafate.  She does report both medications helped with the nausea and vomiting.  She continues with epigastric pain.  I will go ahead and start patient on omeprazole 40 mg twice daily.  I will also schedule upper GI endoscopy with Dr. Rhea Belton to evaluate abnormal finding on CT scan.  Patient was on meloxicam long-term for chronic back pain which increases the risk for NSAID induced ulcers.  Her hemoglobin had dropped from 12.7-10.8 she endorses fatigue.  I will follow-up with CBC and iron panel.  Up-to-date on colon screening.  Plan: - Strict GERD diet, no late meals - Start Omeprazole 40mg  twice daily 30 minutes before breakfast and dinner -Continue Carafate 1g with meals -Check CBC, Iron, TIBC today  -No NSAIDs. No Meloxicam - Schedule EGD in [facility] with Dr. Rhea Belton.The risks and benefits of EGD with possible biopsies and esophageal dilation were discussed with the patient who agrees to proceed.  - 3 months follow-up with me  Thank you for the courtesy of this consult. Please call me with any questions or concerns.   Aixa Corsello, FNP-C [facility] 08/09/2023, 11:52 AM  Cc: Loyola Mast, MD

## 2023-08-09 ENCOUNTER — Ambulatory Visit (HOSPITAL_BASED_OUTPATIENT_CLINIC_OR_DEPARTMENT_OTHER): Payer: 59 | Admitting: Student

## 2023-08-09 ENCOUNTER — Ambulatory Visit: Payer: 59 | Admitting: Gastroenterology

## 2023-08-09 ENCOUNTER — Ambulatory Visit (HOSPITAL_BASED_OUTPATIENT_CLINIC_OR_DEPARTMENT_OTHER): Payer: 59

## 2023-08-09 ENCOUNTER — Encounter: Payer: Self-pay | Admitting: Gastroenterology

## 2023-08-09 ENCOUNTER — Other Ambulatory Visit (INDEPENDENT_AMBULATORY_CARE_PROVIDER_SITE_OTHER): Payer: 59

## 2023-08-09 VITALS — BP 108/64 | HR 65 | Ht 60.0 in | Wt 121.0 lb

## 2023-08-09 DIAGNOSIS — R933 Abnormal findings on diagnostic imaging of other parts of digestive tract: Secondary | ICD-10-CM

## 2023-08-09 DIAGNOSIS — R1013 Epigastric pain: Secondary | ICD-10-CM | POA: Diagnosis not present

## 2023-08-09 DIAGNOSIS — S82892A Other fracture of left lower leg, initial encounter for closed fracture: Secondary | ICD-10-CM | POA: Diagnosis not present

## 2023-08-09 DIAGNOSIS — R5383 Other fatigue: Secondary | ICD-10-CM

## 2023-08-09 DIAGNOSIS — D649 Anemia, unspecified: Secondary | ICD-10-CM | POA: Diagnosis not present

## 2023-08-09 DIAGNOSIS — R112 Nausea with vomiting, unspecified: Secondary | ICD-10-CM

## 2023-08-09 DIAGNOSIS — Z791 Long term (current) use of non-steroidal anti-inflammatories (NSAID): Secondary | ICD-10-CM

## 2023-08-09 LAB — CBC WITH DIFFERENTIAL/PLATELET
Basophils Absolute: 0 10*3/uL (ref 0.0–0.1)
Basophils Relative: 0.7 % (ref 0.0–3.0)
Eosinophils Absolute: 0.1 10*3/uL (ref 0.0–0.7)
Eosinophils Relative: 1.9 % (ref 0.0–5.0)
HCT: 30.2 % — ABNORMAL LOW (ref 36.0–46.0)
Hemoglobin: 9.8 g/dL — ABNORMAL LOW (ref 12.0–15.0)
Lymphocytes Relative: 17.6 % (ref 12.0–46.0)
Lymphs Abs: 1.2 10*3/uL (ref 0.7–4.0)
MCHC: 32.4 g/dL (ref 30.0–36.0)
MCV: 83.3 fL (ref 78.0–100.0)
Monocytes Absolute: 0.7 10*3/uL (ref 0.1–1.0)
Monocytes Relative: 10.2 % (ref 3.0–12.0)
Neutro Abs: 4.6 10*3/uL (ref 1.4–7.7)
Neutrophils Relative %: 69.6 % (ref 43.0–77.0)
Platelets: 380 10*3/uL (ref 150.0–400.0)
RBC: 3.63 Mil/uL — ABNORMAL LOW (ref 3.87–5.11)
RDW: 14.4 % (ref 11.5–15.5)
WBC: 6.6 10*3/uL (ref 4.0–10.5)

## 2023-08-09 LAB — IBC + FERRITIN
Ferritin: 6.7 ng/mL — ABNORMAL LOW (ref 10.0–291.0)
Iron: 17 ug/dL — ABNORMAL LOW (ref 42–145)
Saturation Ratios: 3.5 % — ABNORMAL LOW (ref 20.0–50.0)
TIBC: 490 ug/dL — ABNORMAL HIGH (ref 250.0–450.0)
Transferrin: 350 mg/dL (ref 212.0–360.0)

## 2023-08-09 MED ORDER — OMEPRAZOLE 40 MG PO CPDR: 40.0000 mg | DELAYED_RELEASE_CAPSULE | Freq: Two times a day (BID) | ORAL | 5 refills | Status: DC

## 2023-08-09 NOTE — Progress Notes (Signed)
Chief Complaint: Left ankle injury     History of Present Illness:   08/09/23: Patient is here today for 4-week follow-up after sustaining a left ankle fracture during a fall.  Today she reports doing much better overall.  She is tolerating the walking boot well with minimal pain during weightbearing.  She does still have some residual soreness and swelling around the ankle but this has improved.  She is using Tylenol for pain as needed.   07/12/23: Patricia Dunlap is a 58 y.o. female presenting today for evaluation of a left ankle injury.  She states that she sustained 2 falls this morning after getting out of the shower which she believes was due to passing out.  The cause of these syncopal episodes is unknown.  She states that after getting up from the second episode she noticed significant pain in her left ankle.  This was immediately difficult to weight-bear on.  Rates current pain at an 8/10 and describes this as sharp.  Does have some numbness extending down into the foot.  No previous history of ankle injuries.  Denies any recent medication changes other than an azithromycin she has been taking for bronchitis.  Surgical History:   None  PMH/PSH/Family History/Social History/Meds/Allergies:    Past Medical History:  Diagnosis Date   Allergy    Anemia    resolved after hysterectomy   Heart murmur    Hyperlipidemia    Past Surgical History:  Procedure Laterality Date   ABDOMINAL HYSTERECTOMY     BUNIONECTOMY     COLONOSCOPY  11/04/2016   Dr.Pyrtle   POLYPECTOMY     WISDOM TOOTH EXTRACTION     age 62   Social History   Socioeconomic History   Marital status: Widowed    Spouse name: Not on file   Number of children: 1   Years of education: Not on file   Highest education level: Bachelor's degree (e.g., BA, AB, BS)  Occupational History   Occupation: Designer, industrial/product support    Comment: [employer]  Tobacco Use   Smoking  status: Never   Smokeless tobacco: Never  Vaping Use   Vaping status: Never Used  Substance and Sexual Activity   Alcohol use: Yes    Alcohol/week: 2.0 standard drinks of alcohol    Types: 2 Standard drinks or equivalent per week    Comment: socially   Drug use: No   Sexual activity: Yes  Other Topics Concern   Not on file  Social History Narrative   Not on file   Social Drivers of Health   Financial Resource Strain: Not on file  Food Insecurity: Not on file  Transportation Needs: Not on file  Physical Activity: Not on file  Stress: Not on file  Social Connections: Not on file   Family History  Problem Relation Age of Onset   Hyperlipidemia Mother    Hypertension Father    Other Father        Supranuclear palsy   Colon cancer Neg Hx    Colon polyps Neg Hx    Esophageal cancer Neg Hx    Stomach cancer Neg Hx    Rectal cancer Neg Hx    No Known Allergies Current Outpatient Medications  Medication Sig Dispense Refill   albuterol (VENTOLIN HFA) 108 (90 Base) MCG/ACT  inhaler Inhale 2 puffs into the lungs every 6 (six) hours as needed for wheezing or shortness of breath. 8 g 2   atorvastatin (LIPITOR) 40 MG tablet Take 1 tablet (40 mg total) by mouth daily. 90 tablet 3   cetirizine (ZYRTEC) 10 MG tablet Take 1 tablet (10 mg total) by mouth daily. 30 tablet 0   famotidine (PEPCID) 20 MG tablet Take 1 tablet (20 mg total) by mouth 2 (two) times daily. 30 tablet 0   Multiple Vitamins-Minerals (MULTIVITAMIN WITH MINERALS) tablet Take 1 tablet by mouth daily.     omeprazole (PRILOSEC) 40 MG capsule Take 1 capsule (40 mg total) by mouth in the morning and at bedtime. 60 capsule 5   ondansetron (ZOFRAN-ODT) 4 MG disintegrating tablet Take 1 tablet (4 mg total) by mouth every 8 (eight) hours as needed for nausea or vomiting. 12 tablet 0   RESTASIS 0.05 % ophthalmic emulsion      spironolactone (ALDACTONE) 50 MG tablet Take 1 tablet by mouth once daily 90 tablet 3   sucralfate  (CARAFATE) 1 g tablet Take 1 tablet (1 g total) by mouth 4 (four) times daily -  with meals and at bedtime. 30 tablet 2   VITAMIN D PO Take by mouth daily.     No current facility-administered medications for this visit.   No results found.  Review of Systems:   A ROS was performed including pertinent positives and negatives as documented in the HPI.  Physical Exam :   Constitutional: NAD and appears stated age Neurological: Alert and oriented Psych: Appropriate affect and cooperative There were no vitals taken for this visit.   Comprehensive Musculoskeletal Exam:    Mild soft tissue edema noted of the lateral left ankle without any overlying erythema or warmth.  Tenderness over the distal fibula, ATFL, and posterior medial malleolus.  Active range of motion to 15 degrees dorsiflexion and 30 degrees plantarflexion.  Negative Thompson test.  Dorsalis pedis 2+.  Neurosensory exam intact.  Imaging:   Xray (left ankle 3 views): Minimally displaced distal fibula and posterior medial malleolus fractures with evidence of increased surrounding callus formation.   I personally reviewed and interpreted the radiographs.  Assessment:   58 y.o. female 4 weeks status post fractures to the left distal fibula and posterior medial malleolus without significant displacement.  Symptomatically she is doing much better and has weightbearing in the cam boot without difficulty.  X-rays today do show callus formation without any further displacement.  Given this I have recommended continuing for a few more weeks in the walking boot as well as continued RICE therapy.  I would like to see her back in another 3 weeks to assess healing at that point and may consider transition out of the boot back into normal shoes with or without ankle bracing.  Patient is agreeable to this plan.  Plan :    -Follow-up in 3 weeks for repeat x-ray and reassessment     I personally saw and evaluated the patient, and  participated in the management and treatment plan.  Hazle Nordmann, PA-C Orthopedics

## 2023-08-09 NOTE — Patient Instructions (Addendum)
Your provider has requested that you go to the basement level for lab work before leaving today. Press "B" on the elevator. The lab is located at the first door on the left as you exit the elevator.  We have sent the following medications to your pharmacy for you to pick up at your convenience: omeprazole 40 mg twice daily.  Please avoid all NSAID's. Some examples of NSAID's are as follows: Aspirin (Bufferin, Bayer, and Excedrin) Ibuprofen (Advil, Motrin, Nuprin) Ketoprofen (Actron, Orudis) Naproxen (Aleve) Daypro  Indocin  Lodine  Naprosyn  Relafen  Vimovo Voltaren  CONTINUE: Carafate 1 gram with meals.   You have been scheduled for an endoscopy. Please follow written instructions given to you at your visit today.  If you use inhalers (even only as needed), please bring them with you on the day of your procedure.  If you take any of the following medications, they will need to be adjusted prior to your procedure:   DO NOT TAKE 7 DAYS PRIOR TO TEST- Trulicity (dulaglutide) Ozempic, Wegovy (semaglutide) Mounjaro (tirzepatide) Bydureon Bcise (exanatide extended release)  DO NOT TAKE 1 DAY PRIOR TO YOUR TEST Rybelsus (semaglutide) Adlyxin (lixisenatide) Victoza (liraglutide) Byetta (exanatide) ___________________________________________________________________________   _______________________________________________________  If your blood pressure at your visit was 140/90 or greater, please contact your primary care physician to follow up on this.  _______________________________________________________  If you are age 59 or older, your body mass index should be between 23-30. Your Body mass index is 23.63 kg/m. If this is out of the aforementioned range listed, please consider follow up with your Primary Care Provider.  If you are age 24 or younger, your body mass index should be between 19-25. Your Body mass index is 23.63 kg/m. If this is out of the aformentioned  range listed, please consider follow up with your Primary Care Provider.   ________________________________________________________  The [place] [facility] providers would like to encourage you to use [facility] to communicate with providers for non-urgent requests or questions.  Due to long hold times on the telephone, sending your provider a message by [facility] may be a faster and more efficient way to get a response.  Please allow 48 business hours for a response.  Please remember that this is for non-urgent requests.  _______________________________________________________ GERD in Adults: Diet Changes When you have gastroesophageal reflux disease (GERD), you may need to make changes to your diet. Choosing the right foods can help with your symptoms. Think about working with an expert in healthy eating called a dietitian. They can help you make healthy food choices. What are tips for following this plan? Reading food labels Look for foods that are low in saturated fat. Foods that may help with your symptoms include: Foods with less than 5% of daily value (DV) of fat. Foods with 0 grams of trans fat. Cooking [employer] in ways that don't use a lot of fat. These ways include: Baking. Steaming. Grilling. Broiling. To add flavor, try to use herbs that are low in spice and acidity. Avoid frying your food. Meal planning  Eat small meals often rather than eating 3 large meals each day. Eat your meals slowly in a place where you feel relaxed. If told by your health care provider, avoid: Foods that cause symptoms. Keep a food diary to keep track of foods that cause symptoms. Alcohol. Drinking a lot of liquid with meals. General instructions For 2-3 hours after you eat, avoid: Bending over. Exercise. Lying down. Chew sugar-free gum after meals.  What foods should I eat? Eat a healthy diet. Try to include: Foods with high amounts of fiber. These include: Fruits and vegetables. Whole grains and  beans. Low-fat dairy products. Lean meats, fish, and poultry. Egg whites. Foods that cause symptoms in someone else may not cause symptoms for you. Work with your provider to find foods that are safe for you. The items listed above may not be all the foods and drinks you can have. Talk with a dietitian to learn more. The items listed above may not be a complete list of foods and beverages you can eat and drink. Contact a dietitian for more information. What foods should I avoid? Limiting some of these foods may help with your symptoms. Each person is different. Talk with a dietitian or your provider to help you find the exact foods to avoid. Some of the foods to avoid may include: Fruits Fruits with a lot of acid in them. These may include citrus fruits, such as oranges, grapefruit, pineapple, and lemons. Vegetables Deep-fried vegetables, such as [place] fries. Vegetables, sauces, or toppings made with added fat and vegetables with acid in them. These may include tomatoes and tomato products, chili peppers, onions, garlic, and horseradish. Grains Pastries or quick breads with added fat. Meats and other proteins High-fat meats, such as fatty beef or pork, hot dogs, ribs, ham, sausage, salami, and bacon. Fried meat or protein, such as fried fish and fried chicken. Egg yolks. Fats and oils Butter. Margarine. Shortening. Ghee. Drinks Coffee and other drinks with caffeine in them. Fizzy and sugary drinks, such as soda and energy drinks. Fruit juice made with acidic fruits, such as orange or grapefruit. Tomato juice. Sweets and desserts Chocolate and cocoa. Donuts. Seasonings and condiments Mint, such as peppermint and spearmint. Condiments, herbs, or seasonings that cause symptoms. These may include curry, hot sauce, or vinegar-based salad dressings. The items listed above may not be all the foods and drinks you should avoid. Talk with a dietitian to learn more. Questions to ask your health  care provider Changes to your diet and everyday life are often the first steps taken to manage symptoms of GERD. If these changes don't help, talk with your provider about taking medicines. Where to find more information International Foundation for Gastrointestinal Disorders: aboutgerd.org This information is not intended to replace advice given to you by your health care provider. Make sure you discuss any questions you have with your health care provider. Document Revised: 04/26/2023 Document Reviewed: 11/10/2022 Elsevier Patient Education  2024 [employer].

## 2023-08-09 NOTE — Progress Notes (Signed)
Addendum: Reviewed and agree with assessment and management plan. Asha Grumbine, Carie Caddy, MD

## 2023-08-10 ENCOUNTER — Telehealth: Payer: Self-pay

## 2023-08-10 ENCOUNTER — Other Ambulatory Visit: Payer: Self-pay | Admitting: Internal Medicine

## 2023-08-10 ENCOUNTER — Other Ambulatory Visit: Payer: Self-pay

## 2023-08-10 ENCOUNTER — Other Ambulatory Visit: Payer: Self-pay | Admitting: Gastroenterology

## 2023-08-10 DIAGNOSIS — D509 Iron deficiency anemia, unspecified: Secondary | ICD-10-CM | POA: Insufficient documentation

## 2023-08-10 DIAGNOSIS — D500 Iron deficiency anemia secondary to blood loss (chronic): Secondary | ICD-10-CM

## 2023-08-10 NOTE — Progress Notes (Signed)
Spoke with Dr. Rhea Belton and he assisted me with putting in order for IV iron for patient. Will have RN put in order to recheck Hgb and Iron panel in 3 months.

## 2023-08-10 NOTE — Telephone Encounter (Signed)
Patricia Dunlap, patient will be scheduled as soon as possible.  Auth Submission: NO AUTH NEEDED Site of care: Site of care: [facility] Payer: [employer] Medication & CPT/J Code(s) submitted: Venofer (Iron Sucrose) J1756 Route of submission (phone, fax, portal):  Phone # Fax # Auth type: Buy/Bill PB Units/visits requested: 200mg  x 5 doses Reference number:  Approval from: 08/10/23 to 02/07/24

## 2023-08-10 NOTE — Telephone Encounter (Signed)
Pt notified via mychart regarding IV Iron and that she will be contacted by [facility] to set up the appt. Lab orders and reminder in epic. She knows we will contact her when it is time to have the labs drawn.

## 2023-08-10 NOTE — Telephone Encounter (Signed)
-----   Message from Margarite Gouge May sent at 08/10/2023 10:27 AM EST ----- Regarding: Orders Bonita Quin,   I placed order for IV iron. Dr Rhea Belton would like to recheck her Hgb and Iron panel in 3 months please. If you could notify patient and put in orders for me.  Thank you, Deanna

## 2023-08-11 ENCOUNTER — Encounter: Payer: Self-pay | Admitting: Internal Medicine

## 2023-08-17 ENCOUNTER — Ambulatory Visit: Payer: 59

## 2023-08-18 ENCOUNTER — Encounter: Payer: Self-pay | Admitting: Certified Registered Nurse Anesthetist

## 2023-08-22 ENCOUNTER — Ambulatory Visit (AMBULATORY_SURGERY_CENTER): Payer: 59 | Admitting: Internal Medicine

## 2023-08-22 ENCOUNTER — Encounter: Payer: Self-pay | Admitting: Family Medicine

## 2023-08-22 ENCOUNTER — Encounter: Payer: Self-pay | Admitting: Internal Medicine

## 2023-08-22 VITALS — BP 125/73 | HR 71 | Temp 97.7°F | Resp 17 | Ht 60.0 in | Wt 121.0 lb

## 2023-08-22 DIAGNOSIS — K253 Acute gastric ulcer without hemorrhage or perforation: Secondary | ICD-10-CM

## 2023-08-22 DIAGNOSIS — K2950 Unspecified chronic gastritis without bleeding: Secondary | ICD-10-CM | POA: Diagnosis present

## 2023-08-22 DIAGNOSIS — D509 Iron deficiency anemia, unspecified: Secondary | ICD-10-CM

## 2023-08-22 DIAGNOSIS — K259 Gastric ulcer, unspecified as acute or chronic, without hemorrhage or perforation: Secondary | ICD-10-CM

## 2023-08-22 DIAGNOSIS — R933 Abnormal findings on diagnostic imaging of other parts of digestive tract: Secondary | ICD-10-CM

## 2023-08-22 DIAGNOSIS — R1013 Epigastric pain: Secondary | ICD-10-CM

## 2023-08-22 MED ORDER — SODIUM CHLORIDE 0.9 % IV SOLN: 500.0000 mL | INTRAVENOUS | Status: DC

## 2023-08-22 NOTE — Progress Notes (Signed)
 1010 Robinul 0.1 mg IV given due large amount of secretions upon assessment.  MD made aware, vss

## 2023-08-22 NOTE — Progress Notes (Signed)
 Report given to PACU, vss

## 2023-08-22 NOTE — Patient Instructions (Signed)
 Discharge instructions given. Repeat EGD scheduled. No aspirin,ibuprofen,naproxen or other non-steroidal anti-inflammatory drugs. Resume previous medications. YOU HAD AN ENDOSCOPIC PROCEDURE TODAY AT THE [facility]:   Refer to the procedure report that was given to you for any specific questions about what was found during the examination.  If the procedure report does not answer your questions, please call your gastroenterologist to clarify.  If you requested that your care partner not be given the details of your procedure findings, then the procedure report has been included in a sealed envelope for you to review at your convenience later.  YOU SHOULD EXPECT: Some feelings of bloating in the abdomen. Passage of more gas than usual.  Walking can help get rid of the air that was put into your GI tract during the procedure and reduce the bloating. If you had a lower endoscopy (such as a colonoscopy or flexible sigmoidoscopy) you may notice spotting of blood in your stool or on the toilet paper. If you underwent a bowel prep for your procedure, you may not have a normal bowel movement for a few days.  Please Note:  You might notice some irritation and congestion in your nose or some drainage.  This is from the oxygen used during your procedure.  There is no need for concern and it should clear up in a day or so.  SYMPTOMS TO REPORT IMMEDIATELY:   Following upper endoscopy (EGD)  Vomiting of blood or coffee ground material  New chest pain or pain under the shoulder blades  Painful or persistently difficult swallowing  New shortness of breath  Fever of 100F or higher  Black, tarry-looking stools  For urgent or emergent issues, a gastroenterologist can be reached at any hour by calling [phone]. Do not use MyChart messaging for urgent concerns.    DIET:  We do recommend a small meal at first, but then you may proceed to your regular diet.  Drink plenty of fluids but you  should avoid alcoholic beverages for 24 hours.  ACTIVITY:  You should plan to take it easy for the rest of today and you should NOT DRIVE or use heavy machinery until tomorrow (because of the sedation medicines used during the test).    FOLLOW UP: Our staff will call the number listed on your records the next business day following your procedure.  We will call around 7:15- 8:00 am to check on you and address any questions or concerns that you may have regarding the information given to you following your procedure. If we do not reach you, we will leave a message.     If any biopsies were taken you will be contacted by phone or by letter within the next 1-3 weeks.  Please call us at [phone] if you have not heard about the biopsies in 3 weeks.    SIGNATURES/CONFIDENTIALITY: You and/or your care partner have signed paperwork which will be entered into your electronic medical record.  These signatures attest to the fact that that the information above on your After Visit Summary has been reviewed and is understood.  Full responsibility of the confidentiality of this discharge information lies with you and/or your care-partner.

## 2023-08-22 NOTE — Op Note (Signed)
 [facility] Patient Name: Patricia Dunlap Procedure Date: 08/22/2023 10:10 AM MRN: [id number] Endoscopist: Beverley Fiedler , MD, [id number] Age: 58 Referring MD:  Date of Birth: 1965-09-15 Gender: Female Account #: [id number] Procedure:                Upper GI endoscopy Indications:              Epigastric abdominal pain, Iron deficiency anemia,                            Abnormal CT of the GI tract Medicines:                Monitored Anesthesia Care Procedure:                Pre-Anesthesia Assessment:                           - Prior to the procedure, a History and Physical                            was performed, and patient medications and                            allergies were reviewed. The patient's tolerance of                            previous anesthesia was also reviewed. The risks                            and benefits of the procedure and the sedation                            options and risks were discussed with the patient.                            All questions were answered, and informed consent                            was obtained. Prior Anticoagulants: The patient has                            taken no anticoagulant or antiplatelet agents. ASA                            Grade Assessment: II - A patient with mild systemic                            disease. After reviewing the risks and benefits,                            the patient was deemed in satisfactory condition to                            undergo the procedure.  After obtaining informed consent, the endoscope was                            passed under direct vision. Throughout the                            procedure, the patient's blood pressure, pulse, and                            oxygen saturations were monitored continuously. The                            GIF [id number] #[id number] was introduced through the                            mouth, and advanced to the  second part of duodenum.                            The upper GI endoscopy was accomplished without                            difficulty. The patient tolerated the procedure                            well. Scope In: Scope Out: Findings:                 The examined esophagus was normal.                           One non-bleeding cratered gastric ulcer with no                            stigmata of bleeding was found in the gastric                            antrum. The lesion was 15 mm in largest dimension.                            Biopsies were taken with a cold forceps for                            histology.                           The cardia, gastric fundus, gastric body and                            incisura were normal. Biopsies were taken with a                            cold forceps for Helicobacter pylori testing.                           The examined duodenum was normal. Biopsies for  histology were taken with a cold forceps for                            evaluation of celiac disease. Complications:            No immediate complications. Estimated Blood Loss:     Estimated blood loss: none. Impression:               - Normal esophagus.                           - Non-bleeding gastric ulcer with no stigmata of                            bleeding. Biopsied.                           - Normal cardia, gastric fundus, gastric body and                            incisura. Biopsied.                           - Normal examined duodenum. Biopsied. Recommendation:           - Patient has a contact number available for                            emergencies. The signs and symptoms of potential                            delayed complications were discussed with the                            patient. Return to normal activities tomorrow.                            Written discharge instructions were provided to the                             patient.                           - Resume previous diet.                           - Continue present medications. Continue Omeprazole                            40 mg twice daily (best taken 30 min before your                            first and last meal of the day) at least until next                            EGD.                           -  No aspirin, ibuprofen, naproxen, or other                            non-steroidal anti-inflammatory drugs.                           - Await pathology results.                           - Proceed with IV iron infusions as scheduled.                           - Repeat upper endoscopy in 3 months to check                            healing. Beverley Fiedler, MD 08/22/2023 10:33:14 AM This report has been signed electronically.

## 2023-08-22 NOTE — Progress Notes (Signed)
 VS by DT  Pt's states no medical or surgical changes since previsit or office visit.

## 2023-08-22 NOTE — Progress Notes (Signed)
 See office note dated 08/09/2023 for details and current H&P  Patient presenting for upper endoscopy to evaluate epigastric pain, abnormal CT stomach and iron deficiency anemia  She remains appropriate for upper endoscopy in the [facility] today

## 2023-08-23 ENCOUNTER — Telehealth: Payer: Self-pay | Admitting: *Deleted

## 2023-08-23 NOTE — Telephone Encounter (Signed)
  Follow up Call-     08/22/2023    9:41 AM 03/12/2022    7:11 AM 03/12/2022    7:07 AM  Call back number  Post procedure Call Back phone  # [phone]   Permission to leave phone message Yes  Yes     Patient questions:  Do you have a fever, pain , or abdominal swelling? No. Pain Score  0 *  Have you tolerated food without any problems? Yes.    Have you been able to return to your normal activities? Yes.    Do you have any questions about your discharge instructions: Diet   No. Medications  No. Follow up visit  No.  Do you have questions or concerns about your Care? No.  Actions: * If pain score is 4 or above: No action needed, pain <4.

## 2023-08-24 ENCOUNTER — Ambulatory Visit (INDEPENDENT_AMBULATORY_CARE_PROVIDER_SITE_OTHER): Payer: 59

## 2023-08-24 VITALS — BP 112/71 | HR 91 | Temp 98.1°F | Resp 16 | Ht 60.0 in | Wt 123.0 lb

## 2023-08-24 DIAGNOSIS — D509 Iron deficiency anemia, unspecified: Secondary | ICD-10-CM

## 2023-08-24 DIAGNOSIS — D500 Iron deficiency anemia secondary to blood loss (chronic): Secondary | ICD-10-CM

## 2023-08-24 LAB — SURGICAL PATHOLOGY

## 2023-08-24 MED ORDER — DIPHENHYDRAMINE HCL 25 MG PO CAPS
25.0000 mg | ORAL_CAPSULE | Freq: Once | ORAL | Status: AC
  Administered 2023-08-24: 25 mg via ORAL
  Filled 2023-08-24: qty 1

## 2023-08-24 MED ORDER — IRON SUCROSE 20 MG/ML IV SOLN
200.0000 mg | Freq: Once | INTRAVENOUS | Status: AC
  Administered 2023-08-24: 200 mg via INTRAVENOUS
  Filled 2023-08-24: qty 10

## 2023-08-24 MED ORDER — ACETAMINOPHEN 325 MG PO TABS
650.0000 mg | ORAL_TABLET | Freq: Once | ORAL | Status: AC
  Administered 2023-08-24: 650 mg via ORAL
  Filled 2023-08-24: qty 2

## 2023-08-24 NOTE — Progress Notes (Signed)
 Diagnosis: Iron Deficiency Anemia  Provider:  Chilton Greathouse MD  Procedure: IV Push  IV Type: Peripheral, IV Location: L Antecubital  Venofer (Iron Sucrose), Dose: 200 mg  Post Infusion IV Care: Observation period completed and Peripheral IV Discontinued  Discharge: Condition: Stable, Destination: Home . AVS Provided  Performed by:  Wyvonne Lenz, RN

## 2023-08-25 ENCOUNTER — Encounter: Payer: Self-pay | Admitting: Internal Medicine

## 2023-08-29 ENCOUNTER — Ambulatory Visit: Payer: 59

## 2023-08-29 VITALS — BP 120/78 | HR 85 | Temp 98.0°F | Resp 18 | Ht 60.0 in | Wt 125.2 lb

## 2023-08-29 DIAGNOSIS — D509 Iron deficiency anemia, unspecified: Secondary | ICD-10-CM

## 2023-08-29 DIAGNOSIS — D500 Iron deficiency anemia secondary to blood loss (chronic): Secondary | ICD-10-CM

## 2023-08-29 MED ORDER — IRON SUCROSE 20 MG/ML IV SOLN
200.0000 mg | Freq: Once | INTRAVENOUS | Status: AC
  Administered 2023-08-29: 200 mg via INTRAVENOUS
  Filled 2023-08-29: qty 10

## 2023-08-29 MED ORDER — DIPHENHYDRAMINE HCL 25 MG PO CAPS
25.0000 mg | ORAL_CAPSULE | Freq: Once | ORAL | Status: AC
  Administered 2023-08-29: 25 mg via ORAL
  Filled 2023-08-29: qty 1

## 2023-08-29 MED ORDER — ACETAMINOPHEN 325 MG PO TABS
650.0000 mg | ORAL_TABLET | Freq: Once | ORAL | Status: AC
  Administered 2023-08-29: 650 mg via ORAL
  Filled 2023-08-29: qty 2

## 2023-08-29 NOTE — Progress Notes (Cosign Needed Addendum)
 Diagnosis: Iron Deficiency Anemia  Provider:  Chilton Greathouse MD  Procedure: IV Push  IV Type: Peripheral, IV Location: R Antecubital  Venofer (Iron Sucrose), Dose: 200 mg  Post Infusion IV Care: Observation period completed and Peripheral IV Discontinued  Discharge: Condition: Good, Destination: Home . AVS Declined  Performed by:  Rico Ala, LPN

## 2023-08-31 ENCOUNTER — Ambulatory Visit: Payer: 59

## 2023-08-31 VITALS — BP 117/75 | HR 83 | Temp 98.4°F | Resp 16 | Ht 60.0 in

## 2023-08-31 DIAGNOSIS — D509 Iron deficiency anemia, unspecified: Secondary | ICD-10-CM

## 2023-08-31 DIAGNOSIS — D500 Iron deficiency anemia secondary to blood loss (chronic): Secondary | ICD-10-CM

## 2023-08-31 MED ORDER — DIPHENHYDRAMINE HCL 25 MG PO CAPS
25.0000 mg | ORAL_CAPSULE | Freq: Once | ORAL | Status: AC
  Administered 2023-08-31: 25 mg via ORAL
  Filled 2023-08-31: qty 1

## 2023-08-31 MED ORDER — ACETAMINOPHEN 325 MG PO TABS
650.0000 mg | ORAL_TABLET | Freq: Once | ORAL | Status: AC
  Administered 2023-08-31: 650 mg via ORAL
  Filled 2023-08-31: qty 2

## 2023-08-31 MED ORDER — IRON SUCROSE 20 MG/ML IV SOLN
200.0000 mg | Freq: Once | INTRAVENOUS | Status: AC
  Administered 2023-08-31: 200 mg via INTRAVENOUS
  Filled 2023-08-31: qty 10

## 2023-08-31 NOTE — Progress Notes (Signed)
 Diagnosis: Iron Deficiency Anemia  Provider:  Chilton Greathouse MD  Procedure: IV Push  IV Type: Peripheral, IV Location: R Antecubital  Venofer (Iron Sucrose), Dose: 200 mg  Post Infusion IV Care: Observation period completed and Peripheral IV Discontinued  Discharge: Condition: Stable, Destination: Home . AVS Declined  Performed by:  Wyvonne Lenz, RN

## 2023-09-02 ENCOUNTER — Encounter: Payer: Self-pay | Admitting: Gastroenterology

## 2023-09-02 ENCOUNTER — Ambulatory Visit: Admitting: Physician Assistant

## 2023-09-02 ENCOUNTER — Other Ambulatory Visit (INDEPENDENT_AMBULATORY_CARE_PROVIDER_SITE_OTHER): Payer: Self-pay

## 2023-09-02 ENCOUNTER — Ambulatory Visit (HOSPITAL_BASED_OUTPATIENT_CLINIC_OR_DEPARTMENT_OTHER): Payer: 59 | Admitting: Student

## 2023-09-02 ENCOUNTER — Encounter: Payer: Self-pay | Admitting: Physician Assistant

## 2023-09-02 ENCOUNTER — Encounter (HOSPITAL_BASED_OUTPATIENT_CLINIC_OR_DEPARTMENT_OTHER): Payer: Self-pay

## 2023-09-02 DIAGNOSIS — S82892A Other fracture of left lower leg, initial encounter for closed fracture: Secondary | ICD-10-CM

## 2023-09-02 NOTE — Progress Notes (Signed)
 Office Visit Note   Patient: Patricia Dunlap           Date of Birth: Mar 22, 1966           MRN: [id number] Visit Date: 09/02/2023              Requested by: Loyola Mast, MD [address] [place],  [place] [address] PCP: Loyola Mast, MD  Chief Complaint  Patient presents with   Left Ankle - Pain      HPI: Patricia Dunlap is a pleasant 58 year old woman who is now 6 weeks status post left distal fibula fracture nondisplaced.  She has been followed by Jean Rosenthal.  She has been wearing the boot.  She thinks she is improving and doing much better.  Denies any calf pain with the last 1 She might be an MRI chondral  Assessment & Plan: Visit Diagnoses:  1. Closed left ankle fracture, initial encounter     Plan: 58 year old woman who is 6 weeks status post left distal fibula fracture nondisplaced she has been followed by Jean Rosenthal.  She has been in a cam walker boot weightbearing as tolerated she thinks she is doing well x-rays are reassuring today her exam is show some atrophy but no significant pain she will start weaning out of the boot into a supportive shoe at home.  I gave her home exercises and a piece of Thera-Band.  She can follow-up for final time with Jean Rosenthal in 1 month.  I cautioned her not to wean out of the boot too quickly and to use the boot when she is on uneven ground.  Follow-Up Instructions: Follow-up as needed  Ortho Exam Examination of her left ankle she is neurovascular intact she has good strong pulses foot is warm she still has mild tenderness over the lateral malleolus but no shifting or crepitus.  Negative Homans' sign swelling is overall well-controlled she can dorsiflex plantarflex evert and invert Patient is alert, oriented, no adenopathy, well-dressed, normal affect, normal respiratory effort.   Imaging: No results found. No images are attached to the encounter.  Labs: Lab Results  Component Value Date   HGBA1C 6.0 01/28/2023   HGBA1C 5.7  05/13/2021     Lab Results  Component Value Date   ALBUMIN 4.6 12/27/2018   ALBUMIN 4.3 11/28/2017   ALBUMIN 4.2 09/03/2016    No results found for: "MG" Lab Results  Component Value Date   VD25OH 29.11 (L) 01/28/2023   VD25OH 24.98 (L) 12/10/2021   VD25OH 27.50 (L) 02/29/2020    No results found for: "PREALBUMIN"    Latest Ref Rng & Units 08/09/2023   12:09 PM 07/13/2023    2:08 PM 12/27/2018   11:11 AM  CBC EXTENDED  WBC 4.0 - 10.5 K/uL 6.6  16.2  4.2   RBC 3.87 - 5.11 Mil/uL 3.63  3.67  4.06   Hemoglobin 12.0 - 15.0 g/dL 9.8  19.1  47.8   HCT 29.5 - 46.0 % 30.2  32.3  37.1   Platelets 150.0 - 400.0 K/uL 380.0  394  273.0   NEUT# 1.4 - 7.7 K/uL 4.6   2.4   Lymph# 0.7 - 4.0 K/uL 1.2   1.4      There is no height or weight on file to calculate BMI.  Orders:  Orders Placed This Encounter  Procedures   XR Ankle Complete Left   No orders of the defined types were placed in this encounter.    Procedures: No procedures  performed  Clinical Data: No additional findings.  ROS:  All other systems negative, except as noted in the HPI. Review of Systems  Objective: Vital Signs: There were no vitals taken for this visit.  Specialty Comments:  No specialty comments available.  PMFS History: Patient Active Problem List   Diagnosis Date Noted   Gastric ulcer 08/22/2023   Iron deficiency anemia 08/10/2023   Menopause 06/05/2021   Vitamin D insufficiency 06/05/2021   Prediabetes 06/05/2021   Hyperlipidemia 11/28/2017   Vaginal atrophy 11/28/2017   Acne 11/28/2017   Allergic rhinitis 09/16/2010   Heart murmur 09/16/2010   Past Medical History:  Diagnosis Date   Allergy    Anemia    resolved after hysterectomy   Heart murmur    Hyperlipidemia     Family History  Problem Relation Age of Onset   Hyperlipidemia Mother    Hypertension Father    Other Father        Supranuclear palsy   Colon cancer Neg Hx    Colon polyps Neg Hx    Esophageal cancer Neg  Hx    Stomach cancer Neg Hx    Rectal cancer Neg Hx     Past Surgical History:  Procedure Laterality Date   ABDOMINAL HYSTERECTOMY     BUNIONECTOMY     COLONOSCOPY  11/04/2016   Dr.Pyrtle   POLYPECTOMY     WISDOM TOOTH EXTRACTION     age 30   Social History   Occupational History   Occupation: Designer, industrial/product support    Comment: [employer]  Tobacco Use   Smoking status: Never   Smokeless tobacco: Never  Vaping Use   Vaping status: Never Used  Substance and Sexual Activity   Alcohol use: Yes    Alcohol/week: 2.0 standard drinks of alcohol    Types: 2 Standard drinks or equivalent per week    Comment: socially   Drug use: No   Sexual activity: Yes

## 2023-09-05 ENCOUNTER — Ambulatory Visit: Payer: 59

## 2023-09-05 ENCOUNTER — Encounter: Payer: Self-pay | Admitting: Internal Medicine

## 2023-09-05 VITALS — BP 115/75 | HR 80 | Temp 97.7°F | Resp 20 | Ht 60.0 in | Wt 125.4 lb

## 2023-09-05 DIAGNOSIS — D509 Iron deficiency anemia, unspecified: Secondary | ICD-10-CM

## 2023-09-05 DIAGNOSIS — D500 Iron deficiency anemia secondary to blood loss (chronic): Secondary | ICD-10-CM

## 2023-09-05 MED ORDER — DIPHENHYDRAMINE HCL 25 MG PO CAPS
25.0000 mg | ORAL_CAPSULE | Freq: Once | ORAL | Status: AC
Start: 2023-09-05 — End: 2023-09-05
  Administered 2023-09-05: 25 mg via ORAL
  Filled 2023-09-05: qty 1

## 2023-09-05 MED ORDER — IRON SUCROSE 20 MG/ML IV SOLN
200.0000 mg | Freq: Once | INTRAVENOUS | Status: AC
Start: 2023-09-05 — End: 2023-09-05
  Administered 2023-09-05: 200 mg via INTRAVENOUS
  Filled 2023-09-05: qty 10

## 2023-09-05 MED ORDER — SODIUM CHLORIDE 0.9 % IV BOLUS
250.0000 mL | Freq: Once | INTRAVENOUS | Status: AC
  Administered 2023-09-05: 250 mL via INTRAVENOUS
  Filled 2023-09-05: qty 250

## 2023-09-05 MED ORDER — ACETAMINOPHEN 325 MG PO TABS
650.0000 mg | ORAL_TABLET | Freq: Once | ORAL | Status: AC
  Administered 2023-09-05: 650 mg via ORAL
  Filled 2023-09-05: qty 2

## 2023-09-05 NOTE — Progress Notes (Signed)
 Diagnosis: Iron Deficiency Anemia  Provider:  Chilton Greathouse MD  Procedure: IV Push  IV Type: Peripheral, IV Location: R Antecubital  Venofer (Iron Sucrose), Dose: 200 mg  Post Infusion IV Care: Observation period completed and Peripheral IV Discontinued  Discharge: Condition: Good, Destination: Home . AVS Provided  When heat pack was removed from site at the end of the infusion, Erin RN noticed a dark patch above and below the IV site. Boundaries marked. Patient c/o tightness in upper arm above IV site and tingling in fingers on R hand distal to IV site. Heat pack and then ice pack were alternately applied. Patient observed for 30 minutes. Patient stated the tingling in her fingers had improved but not completely resolved. Bilateral radial pulses +2 and cap refill normal. IV infiltration education added to AVS and patient educated to continue alternating heat/ice to site to comfort. Staff message sent to Dr. Erick Blinks and [facility] May NP, and patient encouraged to follow up with provider. Patient verbalized understanding and all questions answered.  Performed by:  Rico Ala, LPN

## 2023-09-06 ENCOUNTER — Telehealth: Payer: Self-pay

## 2023-09-06 NOTE — Telephone Encounter (Signed)
 Spoke with pt who stated she feels ok. Last night pt stated that she applied ice and heat 2 times each but no ice or heat this morning. Right upper arm is still swollen, stiff and has increased in size. Pt only has pain when she bends her right arm. Pt denied taking any additional medications to treat symptoms. Pt will continue to apply ice and heat to right arm until swelling subsides. Advised pt to follow up with her provider. Pt verbalized understanding.

## 2023-09-08 ENCOUNTER — Ambulatory Visit: Payer: 59

## 2023-09-08 VITALS — BP 121/79 | HR 76 | Temp 98.6°F | Resp 20 | Ht 60.0 in | Wt 123.4 lb

## 2023-09-08 DIAGNOSIS — D509 Iron deficiency anemia, unspecified: Secondary | ICD-10-CM

## 2023-09-08 DIAGNOSIS — D500 Iron deficiency anemia secondary to blood loss (chronic): Secondary | ICD-10-CM

## 2023-09-08 MED ORDER — ACETAMINOPHEN 325 MG PO TABS
650.0000 mg | ORAL_TABLET | Freq: Once | ORAL | Status: AC
Start: 2023-09-08 — End: 2023-09-08
  Administered 2023-09-08: 650 mg via ORAL
  Filled 2023-09-08: qty 2

## 2023-09-08 MED ORDER — IRON SUCROSE 20 MG/ML IV SOLN
200.0000 mg | Freq: Once | INTRAVENOUS | Status: AC
  Administered 2023-09-08: 200 mg via INTRAVENOUS
  Filled 2023-09-08: qty 10

## 2023-09-08 MED ORDER — DIPHENHYDRAMINE HCL 25 MG PO CAPS
25.0000 mg | ORAL_CAPSULE | Freq: Once | ORAL | Status: AC
Start: 2023-09-08 — End: 2023-09-08
  Administered 2023-09-08: 25 mg via ORAL
  Filled 2023-09-08: qty 1

## 2023-09-08 MED ORDER — SODIUM CHLORIDE 0.9 % IV BOLUS
250.0000 mL | Freq: Once | INTRAVENOUS | Status: DC
  Filled 2023-09-08: qty 250

## 2023-09-08 NOTE — Progress Notes (Signed)
 Diagnosis: Iron Deficiency Anemia  Provider:  Chilton Greathouse MD  Procedure: IV Push  IV Type: Peripheral, IV Location: L Forearm  Venofer (Iron Sucrose), Dose: 200 mg  Post Infusion IV Care: Observation period completed and Peripheral IV Discontinued  Discharge: Condition: Good, Destination: Home . AVS Declined  Performed by:  Wyvonne Lenz, RN

## 2023-10-03 ENCOUNTER — Other Ambulatory Visit: Payer: Self-pay | Admitting: Family Medicine

## 2023-10-03 DIAGNOSIS — G8929 Other chronic pain: Secondary | ICD-10-CM

## 2023-10-04 ENCOUNTER — Ambulatory Visit: Admitting: Family Medicine

## 2023-10-04 ENCOUNTER — Encounter: Payer: Self-pay | Admitting: Family Medicine

## 2023-10-04 VITALS — BP 118/70 | HR 80 | Temp 98.3°F | Ht 60.0 in | Wt 124.4 lb

## 2023-10-04 DIAGNOSIS — J3089 Other allergic rhinitis: Secondary | ICD-10-CM

## 2023-10-04 DIAGNOSIS — R053 Chronic cough: Secondary | ICD-10-CM | POA: Diagnosis not present

## 2023-10-04 MED ORDER — AZELASTINE HCL 0.1 % NA SOLN: 2.0000 | Freq: Two times a day (BID) | NASAL | 12 refills | Status: AC

## 2023-10-04 MED ORDER — FLUTICASONE PROPIONATE 50 MCG/ACT NA SUSP: 2.0000 | Freq: Every day | NASAL | 6 refills | Status: AC

## 2023-10-04 NOTE — Progress Notes (Signed)
 [facility] [address] [place] [address] Dept: [phone] Dept Fax: [phone]  Office Visit  Subjective:    Patient ID: Patricia Dunlap, female    DOB: [birth year], 58 y.o..   MRN: [id number]  Chief Complaint  Patient presents with   Cough    C/o having a cough since January.  Coughing up greenish phlegm.  Has taken mucinex and Delysm cough medication.    History of Present Illness:  Patient is in today with a complaint of a cough persisting since January. Patricia Dunlap notes this has been waxing and waning. She has had some recent nasal congestion and sneezing. She denies any heartburn. She also does have some associated hoarseness. She is has no history of smoking. She has been using Mucinex and Delsym periodically for this.  Past Medical History: Patient Active Problem List   Diagnosis Date Noted   Gastric ulcer 08/22/2023   Iron deficiency anemia 08/10/2023   Menopause 06/05/2021   Vitamin D insufficiency 06/05/2021   Prediabetes 06/05/2021   Hyperlipidemia 11/28/2017   Vaginal atrophy 11/28/2017   Acne 11/28/2017   Allergic rhinitis 09/16/2010   Heart murmur 09/16/2010   Past Surgical History:  Procedure Laterality Date   ABDOMINAL HYSTERECTOMY     BUNIONECTOMY     COLONOSCOPY  11/04/2016   Dr.Pyrtle   POLYPECTOMY     WISDOM TOOTH EXTRACTION     age 62   Family History  Problem Relation Age of Onset   Hyperlipidemia Mother    Hypertension Father    Other Father        Supranuclear palsy   Colon cancer Neg Hx    Colon polyps Neg Hx    Esophageal cancer Neg Hx    Stomach cancer Neg Hx    Rectal cancer Neg Hx    Outpatient Medications Prior to Visit  Medication Sig Dispense Refill   albuterol (VENTOLIN HFA) 108 (90 Base) MCG/ACT inhaler Inhale 2 puffs into the lungs every 6 (six) hours as needed for wheezing or shortness of breath. 8 g 2   atorvastatin (LIPITOR) 40 MG tablet Take 1 tablet (40  mg total) by mouth daily. 90 tablet 3   cetirizine (ZYRTEC) 10 MG tablet Take 1 tablet (10 mg total) by mouth daily. 30 tablet 0   Multiple Vitamins-Minerals (MULTIVITAMIN WITH MINERALS) tablet Take 1 tablet by mouth daily.     omeprazole (PRILOSEC) 40 MG capsule Take 1 capsule (40 mg total) by mouth in the morning and at bedtime. 60 capsule 5   spironolactone (ALDACTONE) 50 MG tablet Take 1 tablet by mouth once daily 90 tablet 3   VITAMIN D PO Take by mouth daily.     famotidine (PEPCID) 20 MG tablet Take 1 tablet (20 mg total) by mouth 2 (two) times daily. 30 tablet 0   meloxicam (MOBIC) 15 MG tablet Take 1 tablet by mouth once daily 90 tablet 0   ondansetron (ZOFRAN-ODT) 4 MG disintegrating tablet Take 1 tablet (4 mg total) by mouth every 8 (eight) hours as needed for nausea or vomiting. 12 tablet 0   RESTASIS 0.05 % ophthalmic emulsion      sucralfate (CARAFATE) 1 g tablet Take 1 tablet (1 g total) by mouth 4 (four) times daily -  with meals and at bedtime. 30 tablet 2   No facility-administered medications prior to visit.   No Known Allergies   Objective:   Today's Vitals   10/04/23 1301  BP: 118/70  Pulse: 80  Temp: 98.3 F (36.8 C)  TempSrc: Temporal  SpO2: 100%  Weight: 124 lb 6.4 oz (56.4 kg)  Height: 5' (1.524 m)   Body mass index is 24.3 kg/m.   General: Well developed, well nourished. No acute distress. HEENT: Normocephalic, non-traumatic. PERRL, EOMI. Conjunctiva clear. External ears normal. EAC and TMs   normal bilaterally. Nasal passages are narrow with mild clear rhinorrhea. Mucous membranes moist.   Mild redness of posteiror oropharynx with mild mucous streaking. Good dentition. Neck: Supple. No lymphadenopathy. No thyromegaly. Lungs: Clear to auscultation bilaterally. No wheezing, rales or rhonchi. CV: RRR without murmurs or rubs. Pulses 2+ bilaterally. Psych: Alert and oriented. Normal mood and affect.  Health Maintenance Due  Topic Date Due   HIV  Screening  Never done     Assessment & Plan:  1. Chronic cough (Primary) 2. Allergic rhinitis due to other allergic trigger, unspecified seasonality I suspect the underlying issue is a flare of allergies. Patricia Dunlap should continue her daily cetirizine. I will add Flonase nasal spray 2 puffs in each nostril daily. I will also prescribe azelastine nasal spray.  Recommend hot tea with honey for throat symptoms. Plan follow-up in 2 weeks.  - fluticasone (FLONASE) 50 MCG/ACT nasal spray; Place 2 sprays into both nostrils daily.  Dispense: 16 g; Refill: 6 - azelastine (ASTELIN) 0.1 % nasal spray; Place 2 sprays into both nostrils 2 (two) times daily. Use in each nostril as directed  Dispense: 30 mL; Refill: 12  Return in about 2 weeks (around 10/18/2023) for Reassessment.   Loyola Mast, MD

## 2023-10-04 NOTE — Assessment & Plan Note (Signed)
 I suspect the underlying issue is a flare of allergies. Ms. Lame should continue her daily cetirizine. I will add Flonase nasal spray 2 puffs in each nostril daily. I will also prescribe azelastine nasal spray.  Recommend hot tea with honey for throat symptoms. Plan follow-up in 2 weeks.

## 2023-10-07 ENCOUNTER — Encounter: Payer: Self-pay | Admitting: Family Medicine

## 2023-10-07 DIAGNOSIS — J3089 Other allergic rhinitis: Secondary | ICD-10-CM

## 2023-10-07 DIAGNOSIS — R053 Chronic cough: Secondary | ICD-10-CM

## 2023-10-07 MED ORDER — PREDNISONE 20 MG PO TABS: 20.0000 mg | ORAL_TABLET | Freq: Every day | ORAL | 0 refills | Status: DC

## 2023-10-24 ENCOUNTER — Other Ambulatory Visit: Payer: Self-pay | Admitting: Family Medicine

## 2023-10-24 DIAGNOSIS — J40 Bronchitis, not specified as acute or chronic: Secondary | ICD-10-CM

## 2023-11-08 ENCOUNTER — Ambulatory Visit: Payer: 59 | Admitting: Gastroenterology

## 2023-11-08 ENCOUNTER — Encounter: Payer: Self-pay | Admitting: Gastroenterology

## 2023-11-08 ENCOUNTER — Ambulatory Visit: Payer: Self-pay | Admitting: Gastroenterology

## 2023-11-08 ENCOUNTER — Other Ambulatory Visit (INDEPENDENT_AMBULATORY_CARE_PROVIDER_SITE_OTHER)

## 2023-11-08 VITALS — BP 120/70 | HR 82 | Ht 60.0 in | Wt 125.6 lb

## 2023-11-08 DIAGNOSIS — D509 Iron deficiency anemia, unspecified: Secondary | ICD-10-CM

## 2023-11-08 DIAGNOSIS — S40021A Contusion of right upper arm, initial encounter: Secondary | ICD-10-CM | POA: Diagnosis not present

## 2023-11-08 DIAGNOSIS — K253 Acute gastric ulcer without hemorrhage or perforation: Secondary | ICD-10-CM

## 2023-11-08 DIAGNOSIS — T801XXA Vascular complications following infusion, transfusion and therapeutic injection, initial encounter: Secondary | ICD-10-CM

## 2023-11-08 DIAGNOSIS — I809 Phlebitis and thrombophlebitis of unspecified site: Secondary | ICD-10-CM | POA: Diagnosis not present

## 2023-11-08 LAB — IBC + FERRITIN
Ferritin: 110.2 ng/mL (ref 10.0–291.0)
Iron: 89 ug/dL (ref 42–145)
Saturation Ratios: 25 % (ref 20.0–50.0)
TIBC: 355.6 ug/dL (ref 250.0–450.0)
Transferrin: 254 mg/dL (ref 212.0–360.0)

## 2023-11-08 LAB — CBC WITH DIFFERENTIAL/PLATELET
Basophils Absolute: 0 10*3/uL (ref 0.0–0.1)
Basophils Relative: 0.5 % (ref 0.0–3.0)
Eosinophils Absolute: 0.1 10*3/uL (ref 0.0–0.7)
Eosinophils Relative: 1.1 % (ref 0.0–5.0)
HCT: 40.7 % (ref 36.0–46.0)
Hemoglobin: 13.3 g/dL (ref 12.0–15.0)
Lymphocytes Relative: 20.8 % (ref 12.0–46.0)
Lymphs Abs: 1.2 10*3/uL (ref 0.7–4.0)
MCHC: 32.5 g/dL (ref 30.0–36.0)
MCV: 84.1 fl (ref 78.0–100.0)
Monocytes Absolute: 0.5 10*3/uL (ref 0.1–1.0)
Monocytes Relative: 8.8 % (ref 3.0–12.0)
Neutro Abs: 3.8 10*3/uL (ref 1.4–7.7)
Neutrophils Relative %: 68.8 % (ref 43.0–77.0)
Platelets: 277 10*3/uL (ref 150.0–400.0)
RBC: 4.84 Mil/uL (ref 3.87–5.11)
RDW: 21.3 % — ABNORMAL HIGH (ref 11.5–15.5)
WBC: 5.6 10*3/uL (ref 4.0–10.5)

## 2023-11-08 NOTE — Patient Instructions (Addendum)
 Your provider has requested that you go to the basement level for lab work before leaving today. Press "B" on the elevator. The lab is located at the first door on the left as you exit the elevator.  You have been scheduled for an upper extremity ultrasound at [facility] on 5/14 at 1:00pm. Please arrive 30 minutes prior to your appointment for registration. Make certain not to have anything to eat or drink 6 hours prior to your appointment. Should you need to reschedule your appointment, please contact vascular radiology at [phone]. This test typically takes about 30 minutes to perform.  _______________________________________________________  If your blood pressure at your visit was 140/90 or greater, please contact your primary care physician to follow up on this.  _______________________________________________________  If you are age 37 or older, your body mass index should be between 23-30. Your Body mass index is 24.53 kg/m. If this is out of the aforementioned range listed, please consider follow up with your Primary Care Provider.  If you are age 43 or younger, your body mass index should be between 19-25. Your Body mass index is 24.53 kg/m. If this is out of the aformentioned range listed, please consider follow up with your Primary Care Provider.   ________________________________________________________  The [place] [facility] providers would like to encourage you to use MYCHART to communicate with providers for non-urgent requests or questions.  Due to long hold times on the telephone, sending your provider a message by [facility] may be a faster and more efficient way to get a response.  Please allow 48 business hours for a response.  Please remember that this is for non-urgent requests.  _______________________________________________________;  Thank you for trusting me with your gastrointestinal care. Deanna May, RNP

## 2023-11-08 NOTE — Progress Notes (Signed)
 Chief Complaint: Follow-up gastric ulcer, IDA Primary GI Doctor: Dr. Bridgett Camps  HPI: Patient is a 58 year old African American female patient with past medical history of  anemia, heart murmur, hyperlipidemia, who was referred to me by Graig Lawyer, MD on 07/13/23 following ED visit for abdominal pain and abnormal CT scan. Last seen in GI office by myself on 08/09/23. Patient scheduled for EGD to evaluate abnormal finding and placed on Omeprazole  40 mg BID. Labs show : Iron  17, ferritin 6.7, Hgb 9.8 2/24 EGD- benign gastric ulcer, with negative bx for H. pylori  07/13/2023 patient seen at [facility] ED with a syncopal episode.  Patient fell at home.  CT of the head was negative.  Patient was diagnosed with bronchitis 2 weeks prior and experiencing left-sided chest pain that radiates under her bra line.  CT chest negative.  Patient also complained of lower abdominal pain, nausea and vomiting.  CT scan showed gastric atrium circumferential wall thickening with associated outpouching that could represent ulceration.  Concerns for gastritis.  Underlining malignancy not fully excluded.  Recommend further evaluation with endoscopy.  Electrolytes within normal limits except for mildly elevated calcium  at 10.8.  Patient given 2 L of fluid and discharged. 07/13/23 CT ABD/pelvis  IMPRESSION: 1. No pulmonary embolus. 2. No acute intrathoracic abnormality. 3. Gastric antrum circumferential wall thickening with associated outpouching that could represent ulceration. Concern for gastritis. Underlying malignancy not fully excluded. Recommend further evaluation with endoscopy. 4. Status post hysterectomy.  Interval History    Patient presents for follow-up on EGD which revealed gastric ulcer. Patient taking Pantoprazole 40 mg po twice daily for almost 3 months. She states the epigastric pain has resolved since starting the medication. Patient states she has only taken Meloxicam  once since event.  Reinforced the  importance of avoiding NSAID's.  No black tarry stools or blood in stool.     Patient also received IV iron  infusions 5 infusions at [facility] for iron  deficiency, on 4th infusion she had some bruising and swelling that has subsided however she still has discoloration from bicep to forearm and tenderness.  She states she did compresses which did not seem to resolve the issue.  Wt Readings from Last 3 Encounters:  11/08/23 125 lb 9.6 oz (57 kg)  10/04/23 124 lb 6.4 oz (56.4 kg)  09/08/23 123 lb 6.4 oz (56 kg)     Past Medical History:  Diagnosis Date   Allergy    Anemia    resolved after hysterectomy   Heart murmur    Hyperlipidemia     Past Surgical History:  Procedure Laterality Date   ABDOMINAL HYSTERECTOMY     BUNIONECTOMY     COLONOSCOPY  11/04/2016   Dr.Pyrtle   POLYPECTOMY     WISDOM TOOTH EXTRACTION     age 20    Current Outpatient Medications  Medication Sig Dispense Refill   albuterol  (VENTOLIN  HFA) 108 (90 Base) MCG/ACT inhaler INHALE 2 PUFFS BY MOUTH EVERY 6 HOURS AS NEEDED FOR WHEEZING FOR SHORTNESS OF BREATH 9 g 0   atorvastatin  (LIPITOR) 40 MG tablet Take 1 tablet (40 mg total) by mouth daily. 90 tablet 3   azelastine  (ASTELIN ) 0.1 % nasal spray Place 2 sprays into both nostrils 2 (two) times daily. Use in each nostril as directed 30 mL 12   cetirizine  (ZYRTEC ) 10 MG tablet Take 1 tablet (10 mg total) by mouth daily. 30 tablet 0   fluticasone  (FLONASE ) 50 MCG/ACT nasal spray Place 2 sprays  into both nostrils daily. 16 g 6   Multiple Vitamins-Minerals (MULTIVITAMIN WITH MINERALS) tablet Take 1 tablet by mouth daily.     omeprazole  (PRILOSEC) 40 MG capsule Take 1 capsule (40 mg total) by mouth in the morning and at bedtime. 60 capsule 5   spironolactone  (ALDACTONE ) 50 MG tablet Take 1 tablet by mouth once daily 90 tablet 3   VITAMIN D  PO Take by mouth daily.     No current facility-administered medications for this visit.    Allergies as of 11/08/2023   (No  Known Allergies)    Family History  Problem Relation Age of Onset   Hyperlipidemia Mother    Hypertension Father    Other Father        Supranuclear palsy   Colon cancer Neg Hx    Colon polyps Neg Hx    Esophageal cancer Neg Hx    Stomach cancer Neg Hx    Rectal cancer Neg Hx     Review of Systems:    Constitutional: No weight loss, fever, chills, weakness or fatigue HEENT: Eyes: No change in vision               Ears, Nose, Throat:  No change in hearing or congestion Skin: No rash or itching Cardiovascular: No chest pain, chest pressure or palpitations   Respiratory: No SOB or cough Gastrointestinal: See HPI and otherwise negative Genitourinary: No dysuria or change in urinary frequency Neurological: No headache, dizziness or syncope Musculoskeletal: No new muscle or joint pain Hematologic: No bleeding or bruising Psychiatric: No history of depression or anxiety    Physical Exam:  Vital signs: BP 120/70   Pulse 82   Ht 5' (1.524 m)   Wt 125 lb 9.6 oz (57 kg)   BMI 24.53 kg/m   Constitutional:   Pleasant A.A.  female appears to be in NAD, Well developed, Well nourished, alert and cooperative Throat: Oral cavity and pharynx without inflammation, swelling or lesion.  Respiratory: Respirations even and unlabored. Lungs clear to auscultation bilaterally.   No wheezes, crackles, or rhonchi.  Cardiovascular: Normal S1, S2. Regular rate and rhythm. No peripheral edema, cyanosis or pallor.  Gastrointestinal:  Soft, nondistended, nontender. No rebound or guarding. Normal bowel sounds. No appreciable masses or hepatomegaly. Rectal:  Not performed.  Msk:  Symmetrical without gross deformities. Without edema, no deformity or joint abnormality.  Neurologic:  Alert and  oriented x4;  grossly normal neurologically.  Skin:   discoloration to right bicep,elbow,forearm, mild edema noted, tender to touch, full ROM Psychiatric: Oriented to person, place and time. Demonstrates good  judgement and reason without abnormal affect or behaviors.  RELEVANT LABS AND IMAGING: CBC    Latest Ref Rng & Units 08/09/2023   12:09 PM 07/13/2023    2:08 PM 12/27/2018   11:11 AM  CBC  WBC 4.0 - 10.5 K/uL 6.6  16.2  4.2   Hemoglobin 12.0 - 15.0 g/dL 9.8  11.9  14.7   Hematocrit 36.0 - 46.0 % 30.2  32.3  37.1   Platelets 150.0 - 400.0 K/uL 380.0  394  273.0      CMP     Latest Ref Rng & Units 07/13/2023    2:08 PM 01/28/2023    8:56 AM 05/14/2021   12:00 AM  CMP  Glucose 70 - 99 mg/dL 829  90    BUN 6 - 20 mg/dL 26  26  14       Creatinine 0.44 - 1.00 mg/dL 5.62  1.05  0.8      Sodium 135 - 145 mmol/L 144  139    Potassium 3.5 - 5.1 mmol/L 4.8  4.7    Chloride 98 - 111 mmol/L 95  104    CO2 22 - 32 mmol/L 24  27    Calcium  8.9 - 10.3 mg/dL 16.1  09.6    AST 13 - 35   26      ALT 7 - 35   31         This result is from an external source.     Lab Results  Component Value Date   TSH 0.92 05/14/2021  08/22/23 EGD Impression:  - Normal esophagus.  - Non- bleeding gastric ulcer with no stigmata of bleeding. Biopsied.  - Normal cardia, gastric fundus, gastric body and incisura. Biopsied.  - Normal examined duodenum. Biopsied. Path:  1. Surgical [P], small bowel :      SMALL INTESTINAL MUCOSA WITHOUT SIGNIFICANT DIAGNOSTIC ALTERATION.      NEGATIVE FOR DYSPLASIA OR MALIGNANCY.       2. Surgical [P], colon, antral ulcer :      GASTRIC ANTRAL / OXYNTIC MUCOSA WITH CHRONIC INACTIVE GASTRITIS AND GRANULATION      TISSUE TYPE STROMA SUGGESTIVE OF HEALED ULCER.      NO H. PYLORI IDENTIFIED ON H&E STAIN.      NEGATIVE FOR INTESTINAL METAPLASIA OR DYSPLASIA.       3. Surgical [P], gastric body :      GASTRIC ANTRAL / OXYNTIC MUCOSA WITH CHRONIC INACTIVE GASTRITIS.      NO H. PYLORI IDENTIFIED ON H&E STAIN.      NEGATIVE FOR INTESTINAL METAPLASIA OR DYSPLASIA.   03/12/22 colonoscopy, recall 9/33 Impression:  - The entire examined colon is normal.  - Small internal and  external hemorrhoids.  - No specimens collected. 11/04/16 colonoscopy Impression:  - One 6 mm polyp in the ascending colon, removed with a cold snare. Resected and retrieved.  - One 5 mm polyp at the hepatic flexure, removed with a cold snare. Resected and retrieved.  - The examination was otherwise normal on direct and retroflexion views. 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 - NO HIGH GRADE DYSPLASIA OR MALIGNANCY.  Assessment: Encounter Diagnoses  Name Primary?   Iron  deficiency anemia, unspecified iron  deficiency anemia type Yes   Acute gastric ulcer without hemorrhage or perforation    Superficial bruising of arm, right, initial encounter    Superficial thrombophlebitis after intravenous administration of drug       58 year old female African-American patient who presents for follow-up of gastric ulcer discovered on EGD 2/24 likely due to NSAID use with meloxicam .  Patient was started on pantoprazole 40 mg twice daily which she has been taking for about 3 months.  Patient admits she took meloxicam  once during this period of time.  Reinforced the importance of avoiding NSAIDs and discussing with her PCP alternative options.  Patient also was discovered to have iron  deficiency anemia and received IV iron  infusions.  Will recheck her levels today.  Unfortunately patient had some issues post infusion at IV site, she has some concerns today due to discoloration and tenderness at site.  Will order ultrasound of right arm to rule out DVT.   Plan: - Order Ultrasound of right forearm, elbow,bicep to r/o DVT -No NSAIDs.  -Continue Omeprazole  40 mg twice daily  -Recheck CBC, iron   panel, TIBC today -Follow-up endoscopy (11/24/2023) to re- examine the ulcer  Thank you for the courtesy of this consult. Please call me with any questions or concerns.   Kol Consuegra, FNP-C [facility] 11/08/2023, 9:20 AM  Cc: Graig Lawyer, MD

## 2023-11-09 ENCOUNTER — Encounter (HOSPITAL_COMMUNITY)

## 2023-11-09 ENCOUNTER — Ambulatory Visit (HOSPITAL_COMMUNITY)
Admission: RE | Admit: 2023-11-09 | Discharge: 2023-11-09 | Disposition: A | Discharge status: Home / Self Care | Source: Ambulatory Visit | Attending: Gastroenterology | Admitting: Gastroenterology

## 2023-11-09 DIAGNOSIS — T801XXA Vascular complications following infusion, transfusion and therapeutic injection, initial encounter: Secondary | ICD-10-CM | POA: Diagnosis present

## 2023-11-09 DIAGNOSIS — I809 Phlebitis and thrombophlebitis of unspecified site: Secondary | ICD-10-CM | POA: Insufficient documentation

## 2023-11-09 DIAGNOSIS — D509 Iron deficiency anemia, unspecified: Secondary | ICD-10-CM | POA: Diagnosis present

## 2023-11-09 DIAGNOSIS — S40021A Contusion of right upper arm, initial encounter: Secondary | ICD-10-CM | POA: Diagnosis present

## 2023-11-10 IMAGING — MR MR LUMBAR SPINE W/O CM
4 of 5 series · 23 of 48 positions shown · non-contrast
Comparison: None.

CLINICAL DATA: Lumbar radiculopathy; technologist note states pain
radiating to bilateral legs

EXAM:
MRI LUMBAR SPINE WITHOUT CONTRAST
TECHNIQUE: Multiplanar, multisequence MR imaging of the lumbar spine was
performed. No intravenous contrast was administered.

[Series 5: T2 · sagittal · 4.0mm · 0.73mm/px · 6 of 17 slices shown (1 of 2)]
[im 1/17]
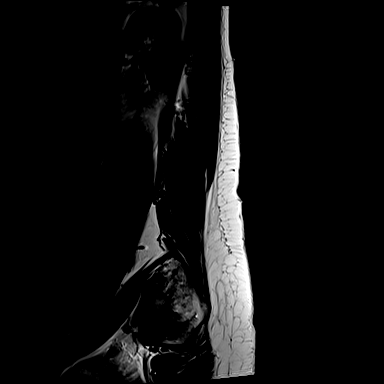
[im 4/17]
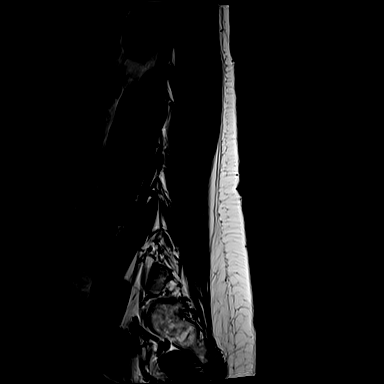
[im 7/17]
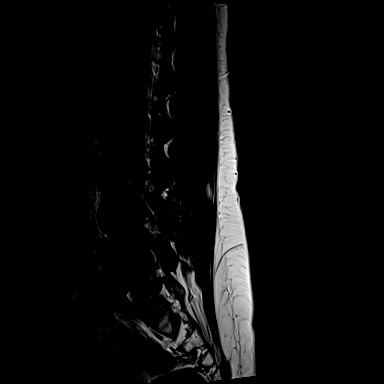
[im 10/17]
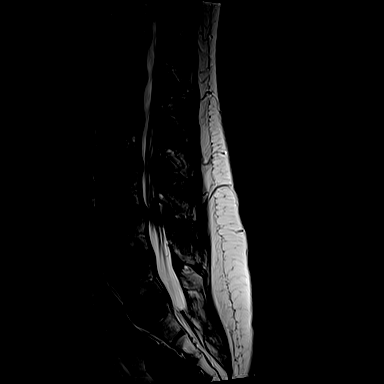
[im 13/17]
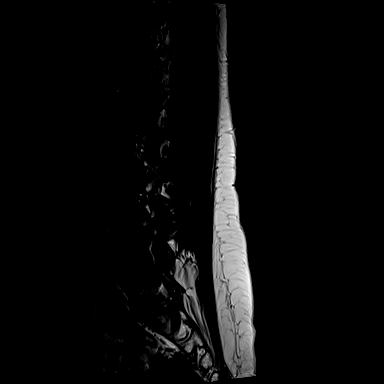
[im 17/17]
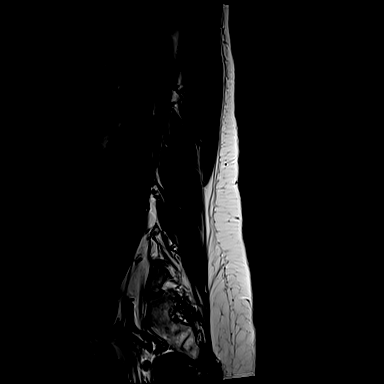

[Series 6: T1 · sagittal · 4.0mm · 0.73mm/px · 5 of 17 slices shown (1 of 2)]
[im 1/17]
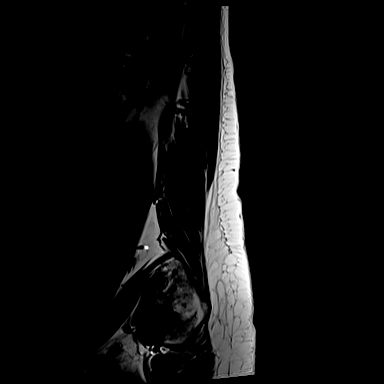
[im 4/17]
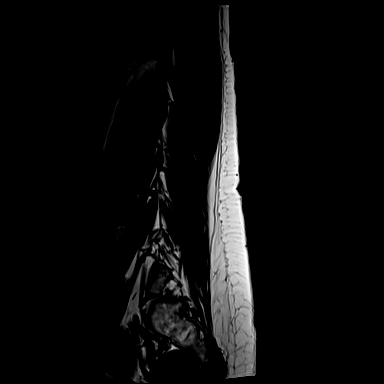
[im 7/17]
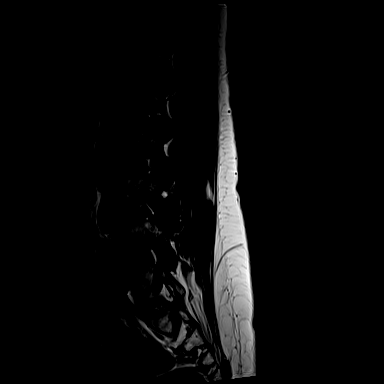
[im 10/17]
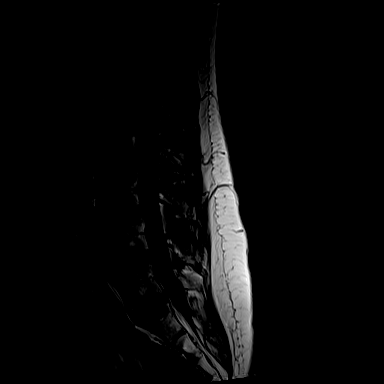
[im 17/17]
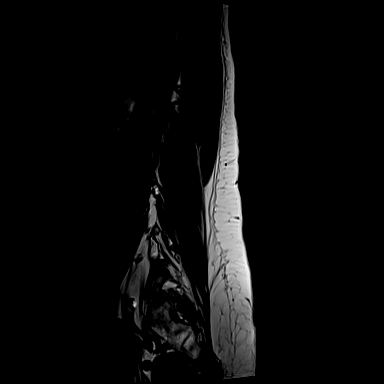

[Series 10: T1 · axial · 4.0mm · 0.28mm/px · z∈[-146,+8]mm · 3 of 41 slices shown (2 of 2)]
[im 6/41]
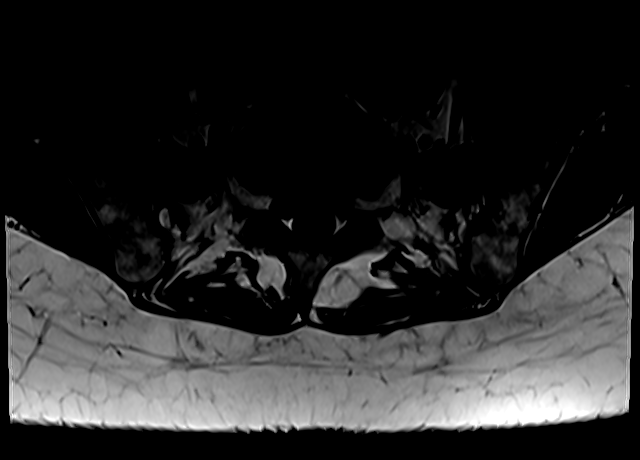
[im 21/41]
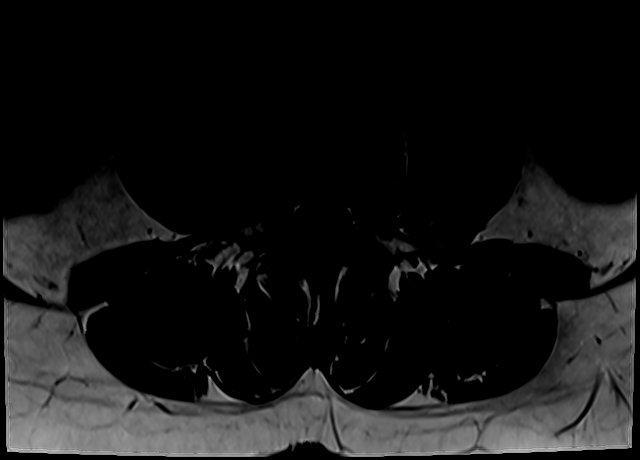
[im 35/41]
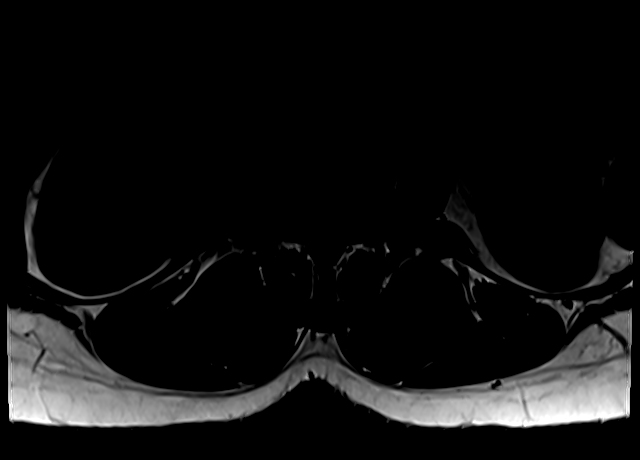

[Series 13: T2 · axial · 4.0mm · 0.28mm/px · z∈[-170,+38]mm · 9 of 41 slices shown (2 of 2)]
[im 1/41]
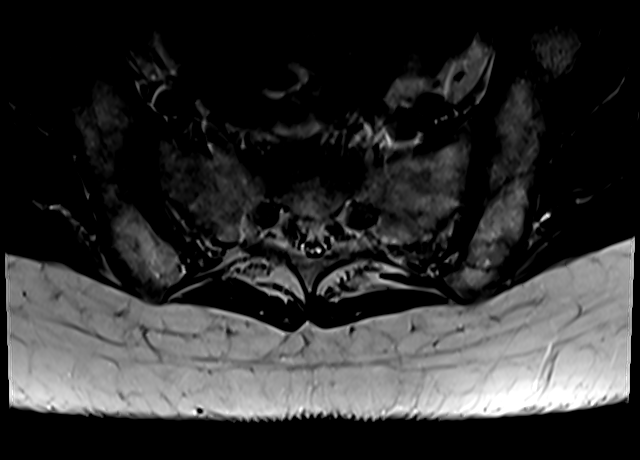
[im 6/41]
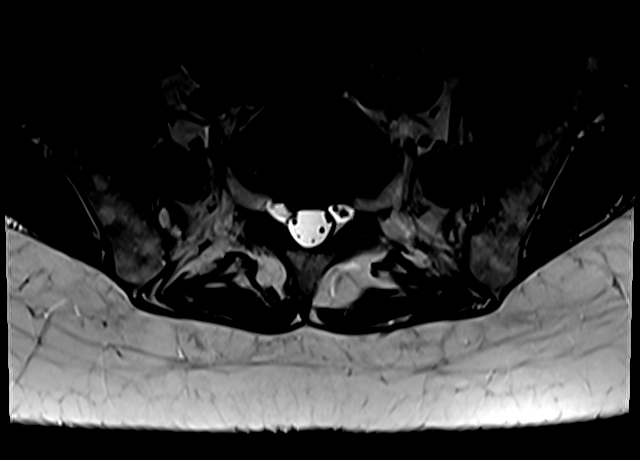
[im 12/41]
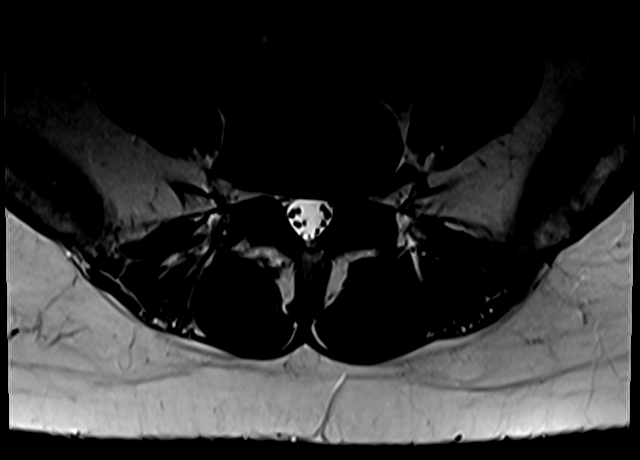
[im 18/41]
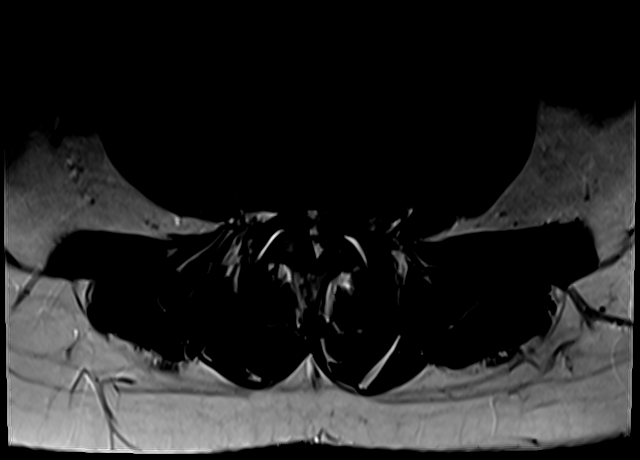
[im 21/41]
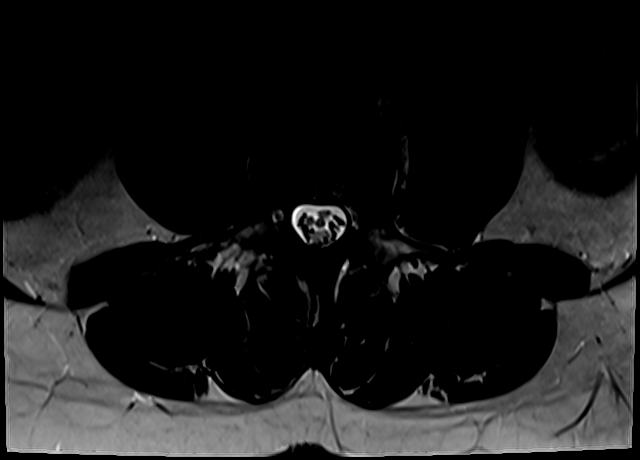
[im 23/41]
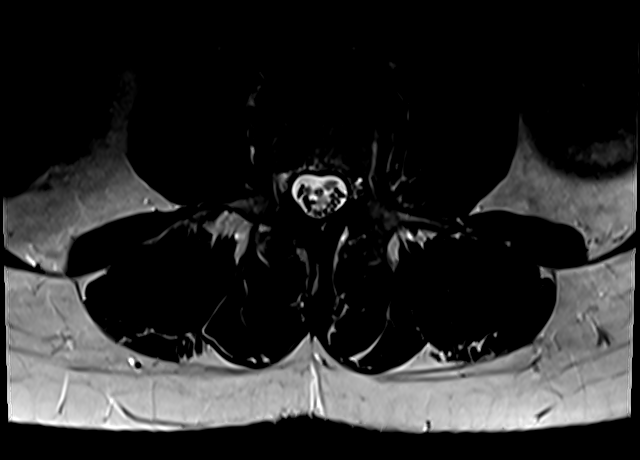
[im 29/41]
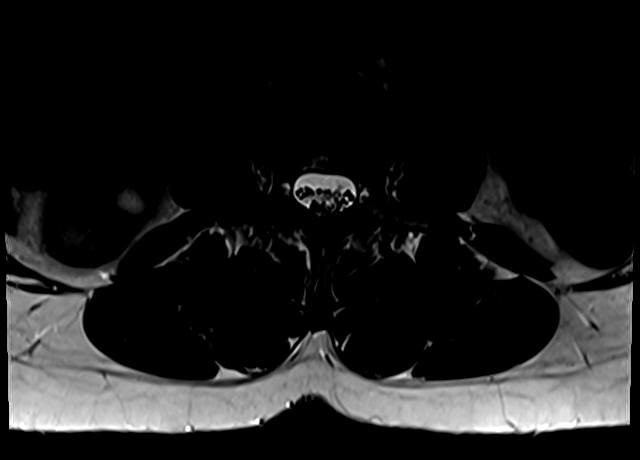
[im 35/41]
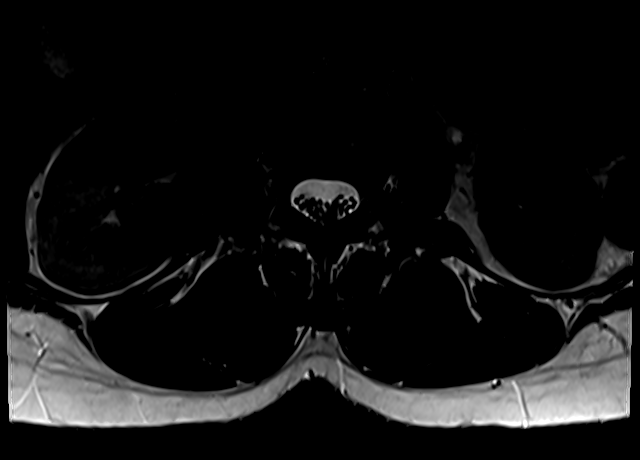
[im 41/41]
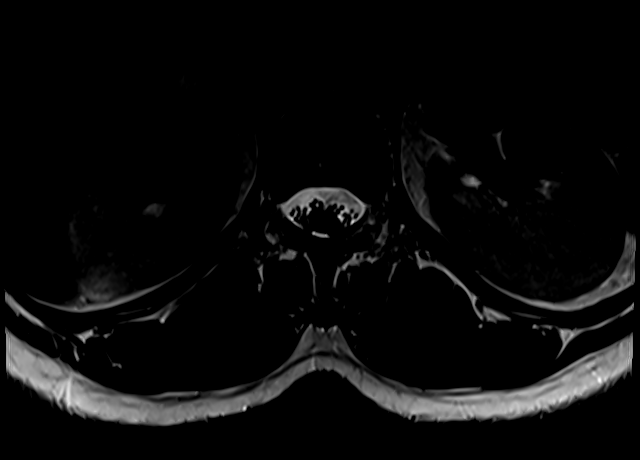

[23 of 48 positions shown; findings below may reference images not displayed]

FINDINGS: Segmentation: Transitional anatomy at the lumbosacral junction. For
the purposes of this dictation, there is a partially lumbarized S1.

Alignment:  Grade 1 anterolisthesis at L4-L5.

Vertebrae: Vertebral body heights are maintained. No substantial
marrow edema. No suspicious osseous lesion.

Conus medullaris and cauda equina: Conus extends to the L1-L2 level.
Conus and cauda equina appear normal.

Paraspinal and other soft tissues: Unremarkable.

Disc levels:

L1-L2:  No stenosis.

L2-L3:  No stenosis.

L3-L4: Minimal disc bulge slightly eccentric to the right. Mild
facet arthropathy. No significant canal or foraminal stenosis.

L4-L5: Anterolisthesis with uncovering of disc bulge. Moderate facet
arthropathy with ligamentum flavum infolding. Moderate canal
stenosis. Partial effacement of subarticular recesses. No
significant foraminal stenosis.

L5-S1:  No stenosis.

S1-S2 rudimentary disc.  No stenosis.
IMPRESSION: Mild degenerative changes as detailed above. There is moderate canal
stenosis at L4-L5 with partial effacement of the subarticular
recesses.

## 2023-11-14 ENCOUNTER — Ambulatory Visit: Admitting: Family Medicine

## 2023-11-15 ENCOUNTER — Other Ambulatory Visit: Payer: Self-pay | Admitting: Family

## 2023-11-15 DIAGNOSIS — J40 Bronchitis, not specified as acute or chronic: Secondary | ICD-10-CM

## 2023-11-17 NOTE — Progress Notes (Signed)
 Addendum: Reviewed and agree with assessment and management plan. Asha Grumbine, Carie Caddy, MD

## 2023-11-22 ENCOUNTER — Other Ambulatory Visit: Payer: Self-pay

## 2023-11-22 DIAGNOSIS — J40 Bronchitis, not specified as acute or chronic: Secondary | ICD-10-CM

## 2023-11-22 MED ORDER — ALBUTEROL SULFATE HFA 108 (90 BASE) MCG/ACT IN AERS: 2.0000 | INHALATION_SPRAY | Freq: Four times a day (QID) | RESPIRATORY_TRACT | 1 refills | Status: AC | PRN

## 2023-11-24 ENCOUNTER — Encounter: Payer: Self-pay | Admitting: Internal Medicine

## 2023-11-24 ENCOUNTER — Ambulatory Visit (AMBULATORY_SURGERY_CENTER): Payer: 59 | Admitting: Internal Medicine

## 2023-11-24 VITALS — BP 127/70 | HR 88 | Temp 97.5°F | Resp 11 | Ht 60.0 in | Wt 125.0 lb

## 2023-11-24 DIAGNOSIS — K259 Gastric ulcer, unspecified as acute or chronic, without hemorrhage or perforation: Secondary | ICD-10-CM

## 2023-11-24 DIAGNOSIS — Z09 Encounter for follow-up examination after completed treatment for conditions other than malignant neoplasm: Secondary | ICD-10-CM

## 2023-11-24 DIAGNOSIS — Z8711 Personal history of peptic ulcer disease: Secondary | ICD-10-CM | POA: Diagnosis not present

## 2023-11-24 MED ORDER — SODIUM CHLORIDE 0.9 % IV SOLN: 500.0000 mL | Freq: Once | INTRAVENOUS | Status: DC

## 2023-11-24 NOTE — Patient Instructions (Signed)
 Can discontinue Omeprazole . If using any NSAIDs then resume omperazole 40 mg once daily   YOU HAD AN ENDOSCOPIC PROCEDURE TODAY AT THE  ENDOSCOPY CENTER:   Refer to the procedure report that was given to you for any specific questions about what was found during the examination.  If the procedure report does not answer your questions, please call your gastroenterologist to clarify.  If you requested that your care partner not be given the details of your procedure findings, then the procedure report has been included in a sealed envelope for you to review at your convenience later.  YOU SHOULD EXPECT: Some feelings of bloating in the abdomen. Passage of more gas than usual.  Walking can help get rid of the air that was put into your GI tract during the procedure and reduce the bloating. If you had a lower endoscopy (such as a colonoscopy or flexible sigmoidoscopy) you may notice spotting of blood in your stool or on the toilet paper. If you underwent a bowel prep for your procedure, you may not have a normal bowel movement for a few days.  Please Note:  You might notice some irritation and congestion in your nose or some drainage.  This is from the oxygen used during your procedure.  There is no need for concern and it should clear up in a day or so.  SYMPTOMS TO REPORT IMMEDIATELY:  Following upper endoscopy (EGD)  Vomiting of blood or coffee ground material  New chest pain or pain under the shoulder blades  Painful or persistently difficult swallowing  New shortness of breath  Fever of 100F or higher  Black, tarry-looking stools  For urgent or emergent issues, a gastroenterologist can be reached at any hour by calling [phone]. Do not use MyChart messaging for urgent concerns.    DIET:  We do recommend a small meal at first, but then you may proceed to your regular diet.  Drink plenty of fluids but you should avoid alcoholic beverages for 24 hours.  ACTIVITY:  You should plan  to take it easy for the rest of today and you should NOT DRIVE or use heavy machinery until tomorrow (because of the sedation medicines used during the test).    FOLLOW UP: Our staff will call the number listed on your records the next business day following your procedure.  We will call around 7:15- 8:00 am to check on you and address any questions or concerns that you may have regarding the information given to you following your procedure. If we do not reach you, we will leave a message.     SIGNATURES/CONFIDENTIALITY: You and/or your care partner have signed paperwork which will be entered into your electronic medical record.  These signatures attest to the fact that that the information above on your After Visit Summary has been reviewed and is understood.  Full responsibility of the confidentiality of this discharge information lies with you and/or your care-partner.

## 2023-11-24 NOTE — Op Note (Signed)
 [facility] Patient Name: Patricia Dunlap Procedure Date: 11/24/2023 11:18 AM MRN: [id number] Endoscopist: Nannette Babe , MD, [id number] Age: 58 Referring MD:  Date of Birth: [birth year] Gender: Female Account #: [id number] Procedure:                Upper GI endoscopy Indications:              Follow-up of acute gastric ulcer seen in Feb 2025                            (H Pylori neg and dysplasia neg at that time); felt                            to be NSAID related Medicines:                Monitored Anesthesia Care Procedure:                Pre-Anesthesia Assessment:                           - Prior to the procedure, a History and Physical                            was performed, and patient medications and                            allergies were reviewed. The patient's tolerance of                            previous anesthesia was also reviewed. The risks                            and benefits of the procedure and the sedation                            options and risks were discussed with the patient.                            All questions were answered, and informed consent                            was obtained. Prior Anticoagulants: The patient has                            taken no anticoagulant or antiplatelet agents. ASA                            Grade Assessment: II - A patient with mild systemic                            disease. After reviewing the risks and benefits,                            the patient was deemed in satisfactory condition to  undergo the procedure.                           After obtaining informed consent, the endoscope was                            passed under direct vision. Throughout the                            procedure, the patient's blood pressure, pulse, and                            oxygen saturations were monitored continuously. The                            GIF [id number] #[id number] was  introduced through the                            mouth, and advanced to the second part of duodenum.                            The upper GI endoscopy was accomplished without                            difficulty. The patient tolerated the procedure                            well. Scope In: Scope Out: Findings:                 The examined esophagus was normal.                           A medium healed ulcer was found in the gastric                            antrum.                           The exam of the stomach was otherwise normal.                           The examined duodenum was normal. Complications:            No immediate complications. Estimated Blood Loss:     Estimated blood loss: none. Impression:               - Normal esophagus.                           - Scarring in the gastric antrum. Previously seen                            antral ulcer has healed.                           - Normal examined duodenum.                           -  No specimens collected. Recommendation:           - Patient has a contact number available for                            emergencies. The signs and symptoms of potential                            delayed complications were discussed with the                            patient. Return to normal activities tomorrow.                            Written discharge instructions were provided to the                            patient.                           - Resume previous diet.                           - Continue present medications. Can discontinue                            omeprazole . If any NSAIDs are used then resume                            omeprazole  40 mg once daily.                           - Iron  studies have normalized. Nannette Babe, MD 11/24/2023 11:30:43 AM This report has been signed electronically.

## 2023-11-24 NOTE — Progress Notes (Signed)
 See office note dated 11/08/2023 for details and current H&P  Patient presenting for upper endoscopy to follow-up gastric ulcer diagnosed by EGD in February 2025 Biopsies negative for H. pylori and dysplasia at that time  She remains appropriate for repeat upper endoscopy here today

## 2023-11-24 NOTE — Progress Notes (Signed)
 Report given to PACU, vss

## 2023-11-24 NOTE — Progress Notes (Signed)
 Pt's states no medical or surgical changes since previsit or office visit.

## 2023-11-24 NOTE — Progress Notes (Signed)
 1117 Robinul 0.1 mg IV given due large amount of secretions upon assessment.  MD made aware, vss

## 2023-11-25 ENCOUNTER — Telehealth: Payer: Self-pay

## 2023-11-25 NOTE — Telephone Encounter (Signed)
 Left message

## 2023-12-23 ENCOUNTER — Encounter: Payer: Self-pay | Admitting: Family Medicine

## 2023-12-23 ENCOUNTER — Ambulatory Visit: Payer: Self-pay | Admitting: Family Medicine

## 2023-12-23 ENCOUNTER — Ambulatory Visit (INDEPENDENT_AMBULATORY_CARE_PROVIDER_SITE_OTHER): Payer: 59 | Admitting: Family Medicine

## 2023-12-23 VITALS — BP 118/68 | HR 76 | Temp 97.0°F | Ht 60.0 in | Wt 125.8 lb

## 2023-12-23 DIAGNOSIS — E782 Mixed hyperlipidemia: Secondary | ICD-10-CM

## 2023-12-23 DIAGNOSIS — R7401 Elevation of levels of liver transaminase levels: Secondary | ICD-10-CM | POA: Insufficient documentation

## 2023-12-23 DIAGNOSIS — D500 Iron deficiency anemia secondary to blood loss (chronic): Secondary | ICD-10-CM | POA: Diagnosis not present

## 2023-12-23 DIAGNOSIS — Z23 Encounter for immunization: Secondary | ICD-10-CM

## 2023-12-23 DIAGNOSIS — Z0000 Encounter for general adult medical examination without abnormal findings: Secondary | ICD-10-CM | POA: Diagnosis not present

## 2023-12-23 DIAGNOSIS — R7303 Prediabetes: Secondary | ICD-10-CM

## 2023-12-23 DIAGNOSIS — G8929 Other chronic pain: Secondary | ICD-10-CM | POA: Insufficient documentation

## 2023-12-23 DIAGNOSIS — E559 Vitamin D deficiency, unspecified: Secondary | ICD-10-CM | POA: Diagnosis not present

## 2023-12-23 DIAGNOSIS — M545 Low back pain, unspecified: Secondary | ICD-10-CM

## 2023-12-23 DIAGNOSIS — L814 Other melanin hyperpigmentation: Secondary | ICD-10-CM | POA: Insufficient documentation

## 2023-12-23 DIAGNOSIS — E785 Hyperlipidemia, unspecified: Secondary | ICD-10-CM

## 2023-12-23 LAB — COMPREHENSIVE METABOLIC PANEL WITH GFR
ALT: 52 U/L — ABNORMAL HIGH (ref 0–35)
AST: 35 U/L (ref 0–37)
Albumin: 4.6 g/dL (ref 3.5–5.2)
Alkaline Phosphatase: 71 U/L (ref 39–117)
BUN: 21 mg/dL (ref 6–23)
CO2: 32 meq/L (ref 19–32)
Calcium: 10.2 mg/dL (ref 8.4–10.5)
Chloride: 102 meq/L (ref 96–112)
Creatinine, Ser: 1 mg/dL (ref 0.40–1.20)
GFR: 62.38 mL/min (ref >60.00)
Glucose, Bld: 92 mg/dL (ref 70–99)
Potassium: 4.8 meq/L (ref 3.5–5.1)
Sodium: 139 meq/L (ref 135–145)
Total Bilirubin: 0.4 mg/dL (ref 0.2–1.2)
Total Protein: 7.4 g/dL (ref 6.0–8.3)

## 2023-12-23 LAB — CBC
HCT: 40.2 % (ref 36.0–46.0)
Hemoglobin: 12.9 g/dL (ref 12.0–15.0)
MCHC: 32.2 g/dL (ref 30.0–36.0)
MCV: 86.9 fl (ref 78.0–100.0)
Platelets: 275 10*3/uL (ref 150.0–400.0)
RBC: 4.62 Mil/uL (ref 3.87–5.11)
RDW: 16.8 % — ABNORMAL HIGH (ref 11.5–15.5)
WBC: 6.8 10*3/uL (ref 4.0–10.5)

## 2023-12-23 LAB — LIPID PANEL
Cholesterol: 208 mg/dL — ABNORMAL HIGH (ref 0–200)
HDL: 86.1 mg/dL (ref >39.00)
LDL Cholesterol: 114 mg/dL — ABNORMAL HIGH (ref 0–99)
NonHDL: 121.87
Total CHOL/HDL Ratio: 2
Triglycerides: 38 mg/dL (ref 0.0–149.0)
VLDL: 7.6 mg/dL (ref 0.0–40.0)

## 2023-12-23 LAB — HEMOGLOBIN A1C: Hgb A1c MFr Bld: 6.4 % (ref 4.6–6.5)

## 2023-12-23 LAB — VITAMIN D 25 HYDROXY (VIT D DEFICIENCY, FRACTURES): VITD: 29.99 ng/mL — ABNORMAL LOW (ref 30.00–100.00)

## 2023-12-23 MED ORDER — MELOXICAM 15 MG PO TABS: 15.0000 mg | ORAL_TABLET | Freq: Every day | ORAL | 11 refills | Status: AC

## 2023-12-23 MED ORDER — ATORVASTATIN CALCIUM 40 MG PO TABS: 60.0000 mg | ORAL_TABLET | Freq: Every day | ORAL | 3 refills | Status: AC

## 2023-12-23 NOTE — Progress Notes (Addendum)
 [facility] [address] [place] [address] Dept: [phone] Dept Fax: [phone]  Annual Physical Visit  Subjective:    Patient ID: Patricia Dunlap, female    DOB: 16-Jul-1965, 58 y.o..   MRN: [id number]  Chief Complaint  Patient presents with   Annual Exam    CPE/labs.  Fasting today.  C/o discoloration on RT arm after iron  transfusion 3 months ago. And cough off/on.    History of Present Illness:  Patient is in today for an annual physical/preventative visit.  Patricia Dunlap has a history of hyperlipidemia. She is managed on atorvastatin  20 mg daily.   Patricia Dunlap has a history of iron  deficiency anemia. She underwent 4 iron  infusions. She notes after her last infusion, she developed darkening of the skin of her right arm. This has not been painful.  Review of Systems  Constitutional:  Negative for chills, diaphoresis, fever, malaise/fatigue and weight loss.       Notes mild weight gain since menopause.  HENT:  Negative for congestion, ear pain, hearing loss, sinus pain, sore throat and tinnitus.   Eyes:  Negative for blurred vision, pain, discharge and redness.  Respiratory:  Positive for cough. Negative for shortness of breath and wheezing.        Had bronchitis last winter. She still gets occasional episodes of cough and mucous production. Has allergic rhinitis. She uses a daily fluticasone  nasal spray.  Cardiovascular:  Negative for chest pain and palpitations.  Gastrointestinal:  Negative for abdominal pain, constipation, diarrhea, heartburn, nausea and vomiting.  Musculoskeletal:  Positive for back pain. Negative for joint pain and myalgias.       History of chronic low back pain without sciatica. Uses meloxicam  at times for flares. She has seen orthopedics, but they did not have much to offer.  Skin:  Negative for itching and rash.       Right arm skin darkening as noted above.  Psychiatric/Behavioral:  Negative  for depression. The patient is not nervous/anxious.    Past Medical History: Patient Active Problem List   Diagnosis Date Noted   Darkening of skin s/p iron  infusion 12/23/2023   Chronic bilateral low back pain without sciatica 12/23/2023   Gastric ulcer 08/22/2023   Iron  deficiency anemia 08/10/2023   Menopause 06/05/2021   Vitamin D  insufficiency 06/05/2021   Prediabetes 06/05/2021   Hyperlipidemia 11/28/2017   Vaginal atrophy 11/28/2017   Acne 11/28/2017   Allergic rhinitis 09/16/2010   Heart murmur 09/16/2010   Past Surgical History:  Procedure Laterality Date   ABDOMINAL HYSTERECTOMY     BUNIONECTOMY     COLONOSCOPY  11/04/2016   Dr.Pyrtle   POLYPECTOMY     WISDOM TOOTH EXTRACTION     age 80   Family History  Problem Relation Age of Onset   Hyperlipidemia Mother    Diabetes Mother    Hypertension Father    Other Father        Supranuclear palsy   Cancer Father    Colon cancer Neg Hx    Colon polyps Neg Hx    Esophageal cancer Neg Hx    Stomach cancer Neg Hx    Rectal cancer Neg Hx    Outpatient Medications Prior to Visit  Medication Sig Dispense Refill   albuterol  (VENTOLIN  HFA) 108 (90 Base) MCG/ACT inhaler Inhale 2 puffs into the lungs every 6 (six) hours as needed for wheezing or shortness of breath. 9 g 1   atorvastatin  (LIPITOR) 40  MG tablet Take 1 tablet (40 mg total) by mouth daily. 90 tablet 3   azelastine  (ASTELIN ) 0.1 % nasal spray Place 2 sprays into both nostrils 2 (two) times daily. Use in each nostril as directed 30 mL 12   cetirizine  (ZYRTEC ) 10 MG tablet Take 1 tablet (10 mg total) by mouth daily. 30 tablet 0   fluticasone  (FLONASE ) 50 MCG/ACT nasal spray Place 2 sprays into both nostrils daily. 16 g 6   Multiple Vitamins-Minerals (MULTIVITAMIN WITH MINERALS) tablet Take 1 tablet by mouth daily.     omeprazole  (PRILOSEC) 40 MG capsule Take 1 capsule (40 mg total) by mouth in the morning and at bedtime. 60 capsule 5   spironolactone  (ALDACTONE )  50 MG tablet Take 1 tablet by mouth once daily 90 tablet 3   VITAMIN D  PO Take by mouth daily.     No facility-administered medications prior to visit.   No Known Allergies Objective:   Today's Vitals   12/23/23 0807  BP: 118/68  Pulse: 76  Temp: (!) 97 F (36.1 C)  TempSrc: Temporal  SpO2: 100%  Weight: 125 lb 12.8 oz (57.1 kg)  Height: 5' (1.524 m)   Body mass index is 24.57 kg/m.   General: Well developed, well nourished. No acute distress. HEENT: Normocephalic, non-traumatic. PERRL, EOMI. Conjunctiva clear. External ears normal. EAC and   TMs normal bilaterally. Nose clear without congestion or rhinorrhea. Mucous membranes moist. Oropharynx   clear. Good dentition. Neck: Supple. No lymphadenopathy. No thyromegaly. Lungs: Clear to auscultation bilaterally. No wheezing, rales or rhonchi. CV: RRR without murmurs or rubs. Pulses 2+ bilaterally. Abdomen: Soft, non-tender. Bowel sounds positive, normal pitch and frequency. No hepatosplenomegaly. No   rebound or guarding. Extremities: Full ROM. Mild subpatellar crepitance. No joint swelling or tenderness. No edema noted. Skin: Warm and dry. There is an irregular area of darkening of the skin over the volar aspect of the right arm   centered on the antecubital fossa. Psych: Alert and oriented. Normal mood and affect.  Health Maintenance Due  Topic Date Due   HIV Screening  Never done   Hepatitis B Vaccines (1 of 3 - 19+ 3-dose series) Never done     Assessment & Plan:   Problem List Items Addressed This Visit       Musculoskeletal and Integument   Darkening of skin s/p iron  infusion   Likely due to infiltration of the iron  infusion. Recommend watchful waiting to see if this will lighten with time.        Other   Annual physical exam - Primary   Overall health is excellent. Recommend regular exercise and calorie-controleld diet to help with mild weight gain.. Discussed recommended screenings and immunizations.        Chronic bilateral low back pain without sciatica   I will renew some meloxicam . I will refer her to PT to have them work at teaching low back exercises and stretching to see if we can reduce this over time.      Relevant Medications   meloxicam  (MOBIC ) 15 MG tablet   Other Relevant Orders   Ambulatory referral to Physical Therapy   Hyperlipidemia   We will check lipids.Continue atorvastatin  40 mg daily.      Relevant Orders   Lipid panel (Completed)   Comprehensive metabolic panel with GFR (Completed)   Iron  deficiency anemia   I will recheck her CBC and iron  levels today.      Relevant Orders   Iron , TIBC and Ferritin  Panel   CBC (Completed)   Prediabetes   I will check annual A1c.      Relevant Orders   Comprehensive metabolic panel with GFR (Completed)   Hemoglobin A1c (Completed)   Vitamin D  insufficiency   I will reassess a Vitamin D  level today.      Relevant Orders   VITAMIN D  25 Hydroxy (Vit-D Deficiency, Fractures) (Completed)   Other Visit Diagnoses       Need for pneumococcal 20-valent conjugate vaccination       Relevant Orders   Pneumococcal conjugate vaccine 20-valent (Completed)       Return in about 1 year (around 12/22/2024) for Annual preventative care.   Garnette CHRISTELLA Simpler, MD

## 2023-12-23 NOTE — Assessment & Plan Note (Signed)
 I will reassess a Vitamin D  level today.

## 2023-12-23 NOTE — Assessment & Plan Note (Signed)
 I will renew some meloxicam . I will refer her to PT to have them work at teaching low back exercises and stretching to see if we can reduce this over time.

## 2023-12-23 NOTE — Assessment & Plan Note (Addendum)
 We will check lipids.Continue atorvastatin  40 mg daily.

## 2023-12-23 NOTE — Assessment & Plan Note (Signed)
 Overall health is excellent. Recommend regular exercise and calorie-controleld diet to help with mild weight gain.. Discussed recommended screenings and immunizations.

## 2023-12-23 NOTE — Assessment & Plan Note (Signed)
I will check annual A1c.

## 2023-12-23 NOTE — Assessment & Plan Note (Signed)
 Likely due to infiltration of the iron  infusion. Recommend watchful waiting to see if this will lighten with time.

## 2023-12-23 NOTE — Assessment & Plan Note (Signed)
I will recheck her CBC and iron levels today 

## 2023-12-24 LAB — IRON,TIBC AND FERRITIN PANEL
%SAT: 17 % (ref 16–45)
Ferritin: 114 ng/mL (ref 16–232)
Iron: 58 ug/dL (ref 45–160)
TIBC: 338 ug/dL (ref 250–450)

## 2024-02-18 ENCOUNTER — Other Ambulatory Visit: Payer: Self-pay | Admitting: Gastroenterology

## 2024-03-20 ENCOUNTER — Other Ambulatory Visit: Payer: Self-pay | Admitting: Gastroenterology

## 2024-04-16 ENCOUNTER — Other Ambulatory Visit: Payer: Self-pay | Admitting: Gastroenterology

## 2024-04-16 ENCOUNTER — Other Ambulatory Visit: Payer: Self-pay

## 2024-04-16 MED ORDER — OMEPRAZOLE 40 MG PO CPDR: 40.0000 mg | DELAYED_RELEASE_CAPSULE | Freq: Two times a day (BID) | ORAL | 3 refills | Status: AC

## 2024-04-30 ENCOUNTER — Encounter: Payer: Self-pay | Admitting: Radiology

## 2024-05-11 LAB — HM MAMMOGRAPHY
